# Patient Record
Sex: Female | Born: 1960 | Race: Black or African American | Hispanic: No | Marital: Single | Smoking: Former smoker
Health system: Southern US, Community
[De-identification: ages and names within clinical notes are randomized; demographics above are authoritative.]

## PROBLEM LIST (undated history)

## (undated) DIAGNOSIS — D649 Anemia, unspecified: Secondary | ICD-10-CM

## (undated) DIAGNOSIS — B019 Varicella without complication: Secondary | ICD-10-CM

## (undated) HISTORY — DX: Anemia, unspecified: D64.9

## (undated) HISTORY — PX: PARTIAL HYSTERECTOMY: SHX80

## (undated) HISTORY — DX: Varicella without complication: B01.9

---

## 1999-02-26 ENCOUNTER — Other Ambulatory Visit: Admission: RE | Admit: 1999-02-26 | Discharge: 1999-02-26 | Payer: Self-pay | Admitting: Internal Medicine

## 2008-04-19 ENCOUNTER — Ambulatory Visit: Payer: Self-pay | Admitting: Internal Medicine

## 2008-04-19 DIAGNOSIS — M810 Age-related osteoporosis without current pathological fracture: Secondary | ICD-10-CM | POA: Insufficient documentation

## 2008-04-19 LAB — CONVERTED CEMR LAB
Albumin: 4 g/dL (ref 3.5–5.2)
Bilirubin Urine: NEGATIVE
Blood in Urine, dipstick: NEGATIVE
CO2: 26 meq/L (ref 19–32)
Calcium: 9.8 mg/dL (ref 8.4–10.5)
Chloride: 111 meq/L (ref 96–112)
Creatinine, Ser: 0.9 mg/dL (ref 0.4–1.2)
GFR calc non Af Amer: 71 mL/min
Glucose, Bld: 97 mg/dL (ref 70–99)
Glucose, Urine, Semiquant: NEGATIVE
HDL: 58 mg/dL (ref >39.0)
Ketones, urine, test strip: NEGATIVE
Lymphocytes Relative: 51.7 % — ABNORMAL HIGH (ref 12.0–46.0)
MCV: 92.1 fL (ref 78.0–100.0)
Monocytes Absolute: 0.4 10*3/uL (ref 0.1–1.0)
Monocytes Relative: 11.2 % (ref 3.0–12.0)
Neutro Abs: 1.2 10*3/uL — ABNORMAL LOW (ref 1.4–7.7)
Neutrophils Relative %: 33.3 % — ABNORMAL LOW (ref 43.0–77.0)
Potassium: 4.7 meq/L (ref 3.5–5.1)
Protein, U semiquant: NEGATIVE
TSH: 0.75 microintl units/mL (ref 0.35–5.50)
Total Protein: 7.6 g/dL (ref 6.0–8.3)
Triglycerides: 40 mg/dL (ref 0–149)
VLDL: 8 mg/dL (ref 0–40)
WBC: 3.5 10*3/uL — ABNORMAL LOW (ref 4.5–10.5)
pH: 5

## 2008-05-01 ENCOUNTER — Encounter: Payer: Self-pay | Admitting: Internal Medicine

## 2008-06-04 ENCOUNTER — Encounter: Payer: Self-pay | Admitting: Internal Medicine

## 2008-12-30 ENCOUNTER — Encounter: Payer: Self-pay | Admitting: Internal Medicine

## 2009-03-18 ENCOUNTER — Telehealth: Payer: Self-pay | Admitting: *Deleted

## 2009-05-07 ENCOUNTER — Ambulatory Visit: Payer: Self-pay | Admitting: Vascular Surgery

## 2010-02-03 ENCOUNTER — Ambulatory Visit: Payer: Self-pay | Admitting: Internal Medicine

## 2010-02-03 LAB — CONVERTED CEMR LAB
ALT: 13 units/L (ref 0–35)
AST: 16 units/L (ref 0–37)
Albumin: 3.7 g/dL (ref 3.5–5.2)
Basophils Absolute: 0 10*3/uL (ref 0.0–0.1)
Bilirubin Urine: NEGATIVE
Cholesterol: 181 mg/dL (ref 0–200)
Creatinine, Ser: 1 mg/dL (ref 0.4–1.2)
Eosinophils Absolute: 0.1 10*3/uL (ref 0.0–0.7)
Eosinophils Relative: 1.8 % (ref 0.0–5.0)
GFR calc non Af Amer: 75.66 mL/min (ref >60)
Glucose, Bld: 94 mg/dL (ref 70–99)
Glucose, Urine, Semiquant: NEGATIVE
HCT: 35.3 % — ABNORMAL LOW (ref 36.0–46.0)
HDL: 53.7 mg/dL (ref >39.00)
Lymphocytes Relative: 50.2 % — ABNORMAL HIGH (ref 12.0–46.0)
MCHC: 33 g/dL (ref 30.0–36.0)
MCV: 94.7 fL (ref 78.0–100.0)
Monocytes Relative: 11.7 % (ref 3.0–12.0)
RBC: 3.73 M/uL — ABNORMAL LOW (ref 3.87–5.11)
Total Bilirubin: 0.3 mg/dL (ref 0.3–1.2)
Triglycerides: 48 mg/dL (ref 0.0–149.0)
WBC: 3.1 10*3/uL — ABNORMAL LOW (ref 4.5–10.5)
pH: 5.5

## 2010-02-10 ENCOUNTER — Ambulatory Visit: Payer: Self-pay | Admitting: Internal Medicine

## 2010-02-10 DIAGNOSIS — D649 Anemia, unspecified: Secondary | ICD-10-CM | POA: Insufficient documentation

## 2010-02-10 DIAGNOSIS — R51 Headache: Secondary | ICD-10-CM | POA: Insufficient documentation

## 2010-02-10 DIAGNOSIS — R82998 Other abnormal findings in urine: Secondary | ICD-10-CM | POA: Insufficient documentation

## 2010-02-10 DIAGNOSIS — R519 Headache, unspecified: Secondary | ICD-10-CM | POA: Insufficient documentation

## 2010-02-10 LAB — CONVERTED CEMR LAB
Basophils Absolute: 0 10*3/uL (ref 0.0–0.1)
Basophils Relative: 0.5 % (ref 0.0–3.0)
Bilirubin Urine: NEGATIVE
Blood in Urine, dipstick: NEGATIVE
Eosinophils Relative: 1.5 % (ref 0.0–5.0)
Ferritin: 69.8 ng/mL (ref 10.0–291.0)
Glucose, Urine, Semiquant: NEGATIVE
Hemoglobin: 11.3 g/dL — ABNORMAL LOW (ref 12.0–15.0)
Ketones, urine, test strip: NEGATIVE
Lymphocytes Relative: 43.9 % (ref 12.0–46.0)
Neutro Abs: 1.3 10*3/uL — ABNORMAL LOW (ref 1.4–7.7)
Platelets: 168 10*3/uL (ref 150.0–400.0)
Protein, U semiquant: NEGATIVE
Saturation Ratios: 19.3 % — ABNORMAL LOW (ref 20.0–50.0)
Specific Gravity, Urine: 1.01
Tissue Transglutaminase Ab, IgA: 0 units (ref <7)
Transferrin: 207.2 mg/dL — ABNORMAL LOW (ref 212.0–360.0)
pH: 5

## 2010-02-20 DIAGNOSIS — D509 Iron deficiency anemia, unspecified: Secondary | ICD-10-CM | POA: Insufficient documentation

## 2010-03-05 ENCOUNTER — Telehealth: Payer: Self-pay | Admitting: *Deleted

## 2010-03-06 ENCOUNTER — Encounter (INDEPENDENT_AMBULATORY_CARE_PROVIDER_SITE_OTHER): Payer: Self-pay | Admitting: *Deleted

## 2010-03-09 ENCOUNTER — Telehealth (INDEPENDENT_AMBULATORY_CARE_PROVIDER_SITE_OTHER): Payer: Self-pay | Admitting: *Deleted

## 2010-03-20 ENCOUNTER — Ambulatory Visit: Payer: Self-pay | Admitting: Internal Medicine

## 2010-03-20 DIAGNOSIS — D72819 Decreased white blood cell count, unspecified: Secondary | ICD-10-CM

## 2010-03-23 LAB — CONVERTED CEMR LAB
Basophils Absolute: 0 10*3/uL (ref 0.0–0.1)
Eosinophils Relative: 2.2 % (ref 0.0–5.0)
HCT: 35 % — ABNORMAL LOW (ref 36.0–46.0)
Lymphs Abs: 1.3 10*3/uL (ref 0.7–4.0)
MCV: 91.6 fL (ref 78.0–100.0)
Monocytes Absolute: 0.6 10*3/uL (ref 0.1–1.0)
Neutrophils Relative %: 52.7 % (ref 43.0–77.0)
Platelets: 179 10*3/uL (ref 150.0–400.0)
RDW: 13.3 % (ref 11.5–14.6)
WBC: 4.3 10*3/uL — ABNORMAL LOW (ref 4.5–10.5)

## 2010-03-25 ENCOUNTER — Encounter: Payer: Self-pay | Admitting: Internal Medicine

## 2010-04-29 ENCOUNTER — Ambulatory Visit: Payer: Self-pay | Admitting: Gastroenterology

## 2010-05-08 ENCOUNTER — Ambulatory Visit: Payer: Self-pay | Admitting: Gastroenterology

## 2010-05-08 LAB — CONVERTED CEMR LAB: Fecal Occult Bld: NEGATIVE

## 2010-06-30 ENCOUNTER — Telehealth: Payer: Self-pay | Admitting: Internal Medicine

## 2010-07-01 ENCOUNTER — Ambulatory Visit: Payer: Self-pay | Admitting: Internal Medicine

## 2010-07-01 DIAGNOSIS — R300 Dysuria: Secondary | ICD-10-CM | POA: Insufficient documentation

## 2010-07-01 DIAGNOSIS — R350 Frequency of micturition: Secondary | ICD-10-CM

## 2010-07-01 LAB — CONVERTED CEMR LAB
Glucose, Urine, Semiquant: NEGATIVE
Nitrite: NEGATIVE
Specific Gravity, Urine: 1.01
WBC Urine, dipstick: NEGATIVE
pH: 6

## 2010-07-02 ENCOUNTER — Encounter: Payer: Self-pay | Admitting: Internal Medicine

## 2010-11-30 ENCOUNTER — Other Ambulatory Visit
Admission: RE | Admit: 2010-11-30 | Discharge: 2010-11-30 | Payer: Self-pay | Source: Home / Self Care | Admitting: Internal Medicine

## 2010-11-30 ENCOUNTER — Ambulatory Visit: Payer: Self-pay | Admitting: Internal Medicine

## 2010-11-30 DIAGNOSIS — N390 Urinary tract infection, site not specified: Secondary | ICD-10-CM

## 2010-11-30 DIAGNOSIS — N760 Acute vaginitis: Secondary | ICD-10-CM | POA: Insufficient documentation

## 2010-11-30 LAB — CONVERTED CEMR LAB
Nitrite: POSITIVE
Specific Gravity, Urine: 1.01
Urobilinogen, UA: 0.2

## 2010-12-01 ENCOUNTER — Encounter: Payer: Self-pay | Admitting: Internal Medicine

## 2010-12-04 ENCOUNTER — Telehealth: Payer: Self-pay | Admitting: Internal Medicine

## 2010-12-09 ENCOUNTER — Ambulatory Visit
Admission: RE | Admit: 2010-12-09 | Discharge: 2010-12-09 | Payer: Self-pay | Source: Home / Self Care | Attending: Internal Medicine | Admitting: Internal Medicine

## 2010-12-09 DIAGNOSIS — A5901 Trichomonal vulvovaginitis: Secondary | ICD-10-CM

## 2011-01-03 ENCOUNTER — Encounter: Payer: Self-pay | Admitting: Internal Medicine

## 2011-01-14 NOTE — Progress Notes (Signed)
Summary: Returning your call  Phone Note Call from Patient Call back at [phone]   Caller: Patient Summary of Call: Patient is returning your call from Friday.  Left no other information. Initial call taken by: Everrett Coombe,  March 09, 2010 2:26 PM  Follow-up for Phone Call        Returned call and advised patient of appt info with GI. Follow-up by: Trixie Dredge,  March 09, 2010 2:43 PM

## 2011-01-14 NOTE — Procedures (Signed)
Summary: Colonoscopy  Patient: [name] Note: All result statuses are Final unless otherwise noted.  Tests: (1) Colonoscopy (COL)   COL Colonoscopy           DONE     [facility]     [address]     [place], [place]  [address]           COLONOSCOPY PROCEDURE REPORT           PATIENT:  Sue, Golden  MR#:  [id number]     BIRTHDATE:  [birth year], 48 yrs. old  GENDER:  female           ENDOSCOPIST:  Barbette Hair. Arlyce Dice, MD     Referred by:  Neta Mends. Panosh, M.D.           PROCEDURE DATE:  05/08/2010     PROCEDURE:  Diagnostic Colonoscopy     ASA CLASS:  Class I     INDICATIONS:  1) anemia           MEDICATIONS:   Fentanyl 75 mcg IV, Versed 8 mg IV           DESCRIPTION OF PROCEDURE:   After the risks benefits and     alternatives of the procedure were thoroughly explained, informed     consent was obtained.  Digital rectal exam was performed and     revealed no abnormalities.   The LB CF-[id number] endoscope     was introduced through the anus and advanced to the cecum, which     was identified by both the appendix and ileocecal valve, without     limitations.  The quality of the prep was good, using MoviPrep.     The instrument was then slowly withdrawn as the colon was fully     examined.     <<PROCEDUREIMAGES>>           FINDINGS:  A normal appearing cecum, ileocecal valve, and     appendiceal orifice were identified. The ascending, hepatic     flexure, transverse, splenic flexure, descending, sigmoid colon,     and rectum appeared unremarkable (see image1, image3, image4,     image6, image7, image8, and image9).   Retroflexed views in the     rectum revealed no abnormalities.    The time to cecum =  5.25     minutes. The scope was then withdrawn (time =  5.0  min) from the     patient and the procedure completed.           COMPLICATIONS:  None           ENDOSCOPIC IMPRESSION:     1) Normal colon     RECOMMENDATIONS:No further GI workup (recent stool  heme negative)           REPEAT EXAM:  In 10 year(s) for Colonoscopy.           ______________________________     Barbette Hair. Arlyce Dice, MD           CC:           n.     eSIGNED:   Barbette Hair. Johnn Krasowski at 05/08/2010 02:26 PM           Wadie Lessen, [id number]  Note: An exclamation mark (!) indicates a result that was not dispersed into the flowsheet. Document Creation Date: 05/08/2010 2:27 PM _______________________________________________________________________  (1) Order result status: Final Collection or observation date-time: 05/08/2010 14:17 Requested date-time:  Receipt date-time:  Reported date-time:  Referring Physician:   Ordering Physician: Melvia Heaps [phone]) Specimen Source:  Source: Launa Grill Order Number: [id number] Lab site:   Appended Document: Colonoscopy    Clinical Lists Changes  Observations: Added new observation of COLONNXTDUE: 04/2020 (05/08/2010 16:03)

## 2011-01-14 NOTE — Progress Notes (Signed)
Summary: UTI  Phone Note Call from Patient Call back at [phone]   Caller: Patient Summary of Call: Pt called saying that she is frequent urination on the hour, no lower back, some discomfort and burning upon urination. This started on 7/18 in am. Pt to come in tomorrow. Initial call taken by: Romualdo Bolk, CMA (AAMA),  June 30, 2010 4:18 PM

## 2011-01-14 NOTE — Letter (Signed)
Summary: [facility] Instructions  [facility]  [address] [place], [place] [address]   Phone: [phone]  Fax: [phone]       SHAMAYA KAUER    [birth year]    MRN: [id number]        Procedure Day /Date:FRIDAY 05/08/2010     Arrival Time:12:30PM     Procedure Time:1:30PM     Location of Procedure:                    [facility] (4th Floor)   PREPARATION FOR COLONOSCOPY WITH MOVIPREP   Starting 5 days prior to your procedure 5/22/2011do not eat nuts, seeds, popcorn, corn, beans, peas,  salads, or any raw vegetables.  Do not take any fiber supplements (e.g. Metamucil, Citrucel, and Benefiber).  THE DAY BEFORE YOUR PROCEDURE         DATE: 05/07/2010  DAY: THURSDAY  1.  Drink clear liquids the entire day-NO SOLID FOOD  2.  Do not drink anything colored red or purple.  Avoid juices with pulp.  No orange juice.  3.  Drink at least 64 oz. (8 glasses) of fluid/clear liquids during the day to prevent dehydration and help the prep work efficiently.  CLEAR LIQUIDS INCLUDE: Water Jello Ice Popsicles Tea (sugar ok, no milk/cream) Powdered fruit flavored drinks Coffee (sugar ok, no milk/cream) Gatorade Juice: apple, white grape, white cranberry  Lemonade Clear bullion, consomm, broth Carbonated beverages (any kind) Strained chicken noodle soup Hard Candy                             4.  In the morning, mix first dose of MoviPrep solution:    Empty 1 Pouch A and 1 Pouch B into the disposable container    Add lukewarm drinking water to the top line of the container. Mix to dissolve    Refrigerate (mixed solution should be used within 24 hrs)  5.  Begin drinking the prep at 5:00 p.m. The MoviPrep container is divided by 4 marks.   Every 15 minutes drink the solution down to the next mark (approximately 8 oz) until the full liter is complete.   6.  Follow completed prep with 16 oz of clear liquid of your choice (Nothing red or purple).   Continue to drink clear liquids until bedtime.  7.  Before going to bed, mix second dose of MoviPrep solution:    Empty 1 Pouch A and 1 Pouch B into the disposable container    Add lukewarm drinking water to the top line of the container. Mix to dissolve    Refrigerate  THE DAY OF YOUR PROCEDURE      DATE:05/08/2010 DAY: FRIDAY  Beginning at 8:30a.m. (5 hours before procedure):         1. Every 15 minutes, drink the solution down to the next mark (approx 8 oz) until the full liter is complete.  2. Follow completed prep with 16 oz. of clear liquid of your choice.    3. You may drink clear liquids until 11:30AM (2 HOURS BEFORE PROCEDURE).   MEDICATION INSTRUCTIONS  Unless otherwise instructed, you should take regular prescription medications with a small sip of water   as early as possible the morning of your procedure. HOLD IRON 7 DAYS PRIOR PER DR KAPLAN         OTHER INSTRUCTIONS  You will need a responsible adult at least 51  years of age to accompany you and drive you home.   This person must remain in the waiting room during your procedure.  Wear loose fitting clothing that is easily removed.  Leave jewelry and other valuables at home.  However, you may wish to bring a book to read or  an iPod/MP3 player to listen to music as you wait for your procedure to start.  Remove all body piercing jewelry and leave at home.  Total time from sign-in until discharge is approximately 2-3 hours.  You should go home directly after your procedure and rest.  You can resume normal activities the  day after your procedure.  The day of your procedure you should not:   Drive   Make legal decisions   Operate machinery   Drink alcohol   Return to work  You will receive specific instructions about eating, activities and medications before you leave.    The above instructions have been reviewed and explained to me by   _______________________    I fully understand and  can verbalize these instructions _____________________________ Date _________

## 2011-01-14 NOTE — Assessment & Plan Note (Signed)
Summary: vag discharge/dm   Vital Signs:  Patient profile:   50 year old female Menstrual status:  hysterectomy Weight:      172 pounds Pulse rate:   78 / minute BP sitting:   130 / 80  (left arm) Cuff size:   regular  Vitals Entered By: Romualdo Bolk, CMA Duncan Dull) (December 09, 2010 12:14 PM) CC: Vaginal Discharge- yellowish with some redness to it on 12/27. Still has irriation.   History of Present Illness: Sue Golden comes in today  for acute appt today for vaginitis  problems  since last visit took antibioitc but ucx showed no growth. she has vaginal irritation   pap and  vaginalysis eval not back yet  1 partner  no known signs   Preventive Screening-Counseling & Management  Alcohol-Tobacco     Alcohol drinks/day: 0     Smoking Status: quit     Year Quit: 1999  Caffeine-Diet-Exercise     Caffeine use/day: 1     Does Patient Exercise: no  Current Medications (verified): 1)  Vitamin D 400 Unit Caps (Cholecalciferol) 2)  Vitamin E 100 Unit Caps (Vitamin E) 3)  Feosol 200 (65 Fe) Mg Tabs (Ferrous Sulfate Dried) .... 2 By Mouth Once Daily 4)  Vitamin C 500 Mg  Tabs (Ascorbic Acid) 5)  Niacin Cr 250 Mg Cr-Tabs (Niacin)  Allergies (verified): No Known Drug Allergies  Past History:  Past medical, surgical, family and social histories (including risk factors) reviewed for relevance to current acute and chronic problems.  Past Medical History: Reviewed history from 11/30/2010 and no changes required. chicken pox as a child G2P2 Anemia UTI citrobacter  Past Surgical History: Reviewed history from 02/10/2010 and no changes required. Hysterectomy bleeding after childbirth  dec 1994  Appendectomy  Family History: Reviewed history from 04/29/2010 and no changes required. Family History Hypertension mom  Died  01-21-23 from CVA 29 Family History Diabetes 1st degree relative father  deceased 94 years, siblings siblings health   DM LIPIDs    Neg for anemia No FH of  Colon Cancer:  Social History: Reviewed history from 04/29/2010 and no changes required. 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    Physical Exam  General:  Well-developed,well-nourished,in no acute distress; alert,appropriate and cooperative throughout examination Head:  normocephalic and atraumatic.   Abdomen:  soft, non-tender, and no masses.   Genitalia:  normal introitus and no external lesions.  watery yellow foamy dc with diffuse vag erythema and faint amount of blood.  cx os clear  Psych:  Oriented X3, normally interactive, good eye contact, and not anxious appearing.     Impression & Recommendations:  Problem # 1:  VAGINITIS (ICD-616.10) Probably trichomonas   as shows  prelim on pap   and clinically consistent currently  bd affirm is still pending.     disc  rx partner etc    will rx now and follow  up closely . The following medications were removed from the medication list:    Nitrofurantoin Macrocrystal 100 Mg Caps (Nitrofurantoin macrocrystal) .Marland Kitchen... 1 by mouth  two times a day Her updated medication list for this problem includes:    Metronidazole 500 Mg Tabs (Metronidazole) .Marland Kitchen... 1.  1 by mouth two times a day for 7 days   2. take 4 pills by mouth x 1  Complete Medication List: 1)  Vitamin D 400 Unit Caps (Cholecalciferol) 2)  Vitamin E 100 Unit Caps (Vitamin e) 3)  Feosol 200 (65 Fe) Mg Tabs (Ferrous sulfate dried) .... 2 by mouth once daily 4)  Vitamin C 500 Mg Tabs (Ascorbic acid) 5)  Niacin Cr 250 Mg Cr-tabs (Niacin) 6)  Metronidazole 500 Mg Tabs (Metronidazole) .... 1. 1 by mouth two times a day for 7 days   2. take 4 pills by mouth x 1  Patient Instructions: 1)  take medication as directed for trich and poss bv 2)   partner should be rx ed also 3)  call in a week about how you are doing.  4)  wil l call when rest of labs back.   Prescriptions: METRONIDAZOLE 500 MG TABS (METRONIDAZOLE) 1. 1 by mouth two times a day for 7 days   2. take 4 pills by mouth x 1  #18 x 1   Entered and Authorized by:   Madelin Headings MD   Signed by:   Madelin Headings MD on 12/09/2010   Method used:   Electronically to        [facility] [phone]* (retail)       [address]       [place], [place]  [address]       Ph: [phone]       Fax: [phone]   RxID:   [id number]    Orders Added: 1)  Est. Patient Level III [59563]

## 2011-01-14 NOTE — Assessment & Plan Note (Signed)
Summary: cpx/cjr   Vital Signs:  Patient profile:   50 year old female Menstrual status:  hysterectomy Height:      65 inches Weight:      163 pounds BMI:     27.22 Pulse rate:   66 / minute BP sitting:   120 / 80  (left arm) Cuff size:   regular  Vitals Entered By: Romualdo Bolk, CMA (AAMA) (February 10, 2010 9:59 AM) CC: CPX     Menstrual Status hysterectomy   History of Present Illness: Sue Golden comesin today for   for preventive visit . Since last visit  here  there have been no major changes in health status  .  No bleeding bruising  .   No blood donation.  No hx of anemia. Feels healthy but tired at times. Mom died  suddenly form CVA in [place] and works long hours . No gi bleeding or abdominal pain. No UTI signs .  HA  see ros  take goodies as needed.       Preventive Care Screening  Mammogram:    Date:  05/01/2008    Results:  normal   Prior Values:    Last Tetanus Booster:  Tdap (04/19/2008)   Preventive Screening-Counseling & Management  Alcohol-Tobacco     Alcohol drinks/day: 0     Smoking Status: quit     Year Quit: 1999  Caffeine-Diet-Exercise     Caffeine use/day: 1     Does Patient Exercise: no  Hep-HIV-STD-Contraception     Dental Visit-last 6 months yes  Safety-Violence-Falls     Seat Belt Use: yes     Firearms in the Home: no firearms in the home     Smoke Detectors: yes  Current Medications (verified): 1)  Biotin 10 Mg Tabs (Biotin) 2)  Vitamin D 400 Unit Caps (Cholecalciferol) 3)  Vitamin E 100 Unit Caps (Vitamin E)  Allergies (verified): No Known Drug Allergies  Past History:  Past medical, surgical, family and social histories (including risk factors) reviewed, and no changes noted (except as noted below).  Past Medical History: Unremarkable chicken pox as a child G2P2    Past Surgical History: Hysterectomy bleeding after childbirth  dec 1994  Appendectomy  Past History:  Care Management: Dermatology:  [facility]  Family History: Reviewed history from 04/19/2008 and no changes required. Family History Hypertension mom  Died  12-25-2022 from CVA 48 Family History Diabetes 1st degree relative father  deceased 47 years  siblings health   DM LIPIDs    Neg colon polyps of cancer .    Social History: Reviewed history from 04/19/2008 and no changes required. 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         ha  at times   ? if sinus  problem.    takes a goodie powder  as needed   about  4 x per month or less.     tingling  right fingers at night ocass no weakness  thinksrelated to working  situation. Physical Exam General Appearance: well developed, well nourished, no acute distress Eyes: conjunctiva and lids normal, PERRLA, EOMI,  WNL Ears, Nose, Mouth, Throat: TM clear, nares clear, oral exam WNL Neck: supple, no lymphadenopathy, no thyromegaly, no JVD Respiratory: clear to auscultation and percussion, respiratory effort normal Cardiovascular: regular rate and rhythm, S1-S2, no murmur, rub or gallop, no bruits, peripheral pulses normal and symmetric, no cyanosis, clubbing, edema or varicosities Chest: no scars, masses, tenderness; no asymmetry, skin changes, nipple discharge     Gastrointestinal: soft, non-tender; no hepatosplenomegaly, masses; active bowel sounds all quadrants, guaiac negative stool; no masses, tenderness, hemorrhoids   Genitourinary: no vaginal discharge, lesions; no masses or tenderness  bimanual nl  Lymphatic: no cervical, axillary or inguinal adenopathy Musculoskeletal: gait normal, muscle tone and strength WNL, no joint swelling, effusions, discoloration, crepitus  Skin: clear, good turgor, color WNL, no rashes, lesions, or ulcerations Neurologic: normal mental status, normal reflexes, normal strength, sensation, and motion Psychiatric: alert; oriented to person, place and time Other Exam:   labs   hg 11.7  wbc low    urine nitirtes positive.     Impression & Recommendations:  Problem # 1:  PREVENTIVE HEALTH CARE (ICD-V70.0) Discussed nutrition,exercise,diet,healthy weight, vitamin D and calcium.   Problem # 2:  UNSPECIFIED ANEMIA (ICD-285.9) Assessment: New  ? cause   no   symptom    normal exam  need further work up. Orders: TLB-CBC Platelet - w/Differential (85025-CBCD) TLB-B12 + Folate Pnl ([id number]/URK) TLB-IBC Pnl (Iron/FE;Transferrin) (83550-IBC) TLB-Ferritin (82728-FER) T-Celiac Disease Ab Evaluation (8002) TLB-Sedimentation Rate (ESR) (85652-ESR) Venipuncture (27062)  Hgb: 11.7 (02/03/2010)   Hct: 35.3 (02/03/2010)   Platelets: 189.0 (02/03/2010) RBC: 3.73 (02/03/2010)   RDW: 11.9 (02/03/2010)   WBC: 3.1 (02/03/2010) MCV: 94.7 (02/03/2010)   MCHC: 33.0 (02/03/2010) TSH: 1.25 (02/03/2010)  Problem # 3:  HEADACHE (ICD-784.0) need to ascertain calendar      avoid  rebound  Problem # 4:  URINALYSIS, ABNORMAL (ICD-791.9) Assessment: New ? uti bacteriuria .    Orders: T-Culture, Urine ([id number])  Complete Medication List: 1)  Biotin 10 Mg Tabs (Biotin) 2)  Vitamin D 400 Unit Caps (Cholecalciferol) 3)  Vitamin E 100 Unit Caps (Vitamin e)  Patient Instructions: 1)  will do  check about your mild anemia and   culture test of your urine. 2)  You will be informed of lab results when available. and then plan follow up  3)  Do HA calendar for the next month  and  rov in 1 months  4)  Get  stool cards done.   Laboratory Results   Urine Tests  Date/Time Received: February 10, 2010 10:22 AM   Routine Urinalysis   Color: yellow Appearance: Cloudy Glucose: negative   (Normal Range: Negative) Bilirubin: negative   (Normal Range: Negative) Ketone: negative   (Normal Range: Negative) Spec. Gravity: 1.010   (Normal Range: 1.003-1.035) Blood: negative   (Normal Range: Negative) pH: 5.0   (Normal Range: 5.0-8.0) Protein: negative   (Normal Range: Negative) Urobilinogen: 0.2   (Normal Range: 0-1) Nitrite: positive   (Normal Range: Negative) Leukocyte Esterace: trace   (Normal Range: Negative)

## 2011-01-14 NOTE — Progress Notes (Signed)
Summary: Ur culture results  Phone Note Call from Patient   Caller: Patient Call For: Madelin Headings MD Summary of Call: Sue Golden [facility] [address]) Calling for Urine culture results. [phone] Initial call taken by: Lynann Beaver CMA AAMA,  December 04, 2010 12:25 PM  Follow-up for Phone Call        no growth. can finish the abx but doesn't need to change abx Follow-up by: Edwyna Perfect MD,  December 04, 2010 12:30 PM  Additional Follow-up for Phone Call Additional follow up Details #1::        pt aware and verbalized understanding

## 2011-01-14 NOTE — Letter (Signed)
Summary: Return to Work  [facility]  [address] [place], [place] [address]   Phone: [phone]  Fax: [phone]    04/29/2010  TO: Sue Golden IT MAY CONCERN  RE: Sue Golden [address] [id number]   The above named individual was seen in our office today: 04/29/2010    If you have any further questions or need additional information, please call.     Sincerely,   Talani Brazee,MD typed by: [name] CMA (AAMA)

## 2011-01-14 NOTE — Letter (Signed)
Summary: Results Letter  [facility]  [address] [place], [place] [address]   Phone: [phone]  Fax: [phone]        Apr 29, 2010 MRN: [id number]    [facility] [address] [place], [place]  [address]    Dear Ms. Bumpus,  It is my pleasure to have treated you recently as a new patient in my office. I appreciate your confidence and the opportunity to participate in your care.  Since I do have a busy inpatient endoscopy schedule and office schedule, my office hours vary weekly. I am, however, available for emergency calls everyday through my office. If I am not available for an urgent office appointment, another one of our gastroenterologist will be able to assist you.  My well-trained staff are prepared to help you at all times. For emergencies after office hours, a physician from our Gastroenterology section is always available through my 24 hour answering service  Once again I welcome you as a new patient and I look forward to a happy and healthy relationship             Sincerely,  Louis Meckel MD  This letter has been electronically signed by your physician.  Appended Document: Results Letter letter mailed

## 2011-01-14 NOTE — Progress Notes (Signed)
Summary: Referral  Phone Note Call from Patient Call back at [phone]   Caller: Patient Reason for Call: Talk to Nurse Summary of Call: Wants to speak to you regarding a referral. Initial call taken by: Everrett Coombe,  March 05, 2010 2:44 PM  Follow-up for Phone Call        [facility] Follow-up by: Romualdo Bolk, CMA Duncan Dull),  March 06, 2010 1:07 PM  Additional Follow-up for Phone Call Additional follow up Details #1::        [facility]MT[facility]- See phone note from 3/28. Pt is aware of appt. Additional Follow-up by: Romualdo Bolk, CMA (AAMA),  March 11, 2010 12:05 PM

## 2011-01-14 NOTE — Assessment & Plan Note (Signed)
Summary: UTI sx/ssc   Vital Signs:  Patient profile:   50 year old female Menstrual status:  hysterectomy Weight:      166 pounds Temp:     98.4 degrees F oral Pulse rate:   78 / minute BP sitting:   154 / 80  (left arm) Cuff size:   regular  Vitals Entered By: Romualdo Bolk, CMA (AAMA) (July 01, 2010 12:17 PM) CC: Frequent urination, vaginal discomfort that started 7/18   History of Present Illness: Sue Golden  sudden onset of urinary frequency  for 2 days  .      mild discomfort  and  no sig dysuria.  no change in diet or caffine input    used cranberry juice     for the last t 2 days.  No fever flank pain abd pain or sores.   Unsure what is causing .signs   no vag dc or sig et itching.      Preventive Screening-Counseling & Management  Alcohol-Tobacco     Alcohol drinks/day: 0     Smoking Status: quit     Year Quit: 1999  Caffeine-Diet-Exercise     Caffeine use/day: 1     Does Patient Exercise: no  Current Medications (verified): 1)  Biotin 10 Mg Tabs (Biotin) 2)  Vitamin D 400 Unit Caps (Cholecalciferol) 3)  Vitamin E 100 Unit Caps (Vitamin E) 4)  Feosol 200 (65 Fe) Mg Tabs (Ferrous Sulfate Dried) .... 2 By Mouth Once Daily 5)  Vitamin C 500 Mg  Tabs (Ascorbic Acid)  Allergies (verified): No Known Drug Allergies  Past History:  Past medical, surgical, family and social histories (including risk factors) reviewed for relevance to current acute and chronic problems.  Past Medical History: Reviewed history from 04/29/2010 and no changes required. chicken pox as a child G2P2 Anemia  Past Surgical History: Reviewed history from 02/10/2010 and no changes required. Hysterectomy bleeding after childbirth  dec 1994  Appendectomy  Past History:  Care Management: Dermatology: Joseph Art  Family History: Reviewed history from 04/29/2010 and no changes required. Family History Hypertension mom  Died  01/16/2023 from CVA 52 Family History Diabetes 1st degree  relative father  deceased 55 years, siblings siblings health   DM LIPIDs    Neg for anemia No FH of Colon Cancer:  Social History: Reviewed history from 04/29/2010 and no changes required. 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   65+ hours per  week  Daily Caffeine Use 1 cup coffee  Review of Systems  The patient denies anorexia, fever, weight loss, weight gain, vision loss, abdominal pain, melena, severe indigestion/heartburn, hematuria, incontinence, genital sores, difficulty walking, and unusual weight change.    Physical Exam  General:  alert, well-developed, well-nourished, and well-hydrated.   Head:  normocephalic and atraumatic.   Neck:  No deformities, masses, or tenderness noted. Lungs:  normal respiratory effort, no intercostal retractions, and no accessory muscle use.   Heart:  normal rate and regular rhythm.   Abdomen:  Bowel sounds positive,abdomen soft and non-tender without masses, organomegaly or hernias noted. no flank pain Genitalia:  normal introitus, no external lesions, mucosa pink and moist, no vaginal or cervical lesions, no vaginal atrophy, and no friaility or hemorrhage.  no lesions   white dicharge  Extremities:  pulses intact without delay    no clubbing cyanosis or  edema  Skin:  turgor normal, color normal, no ecchymoses, and no petechiae.   Cervical Nodes:  No lymphadenopathy noted Inguinal Nodes:  No significant adenopathy Psych:  Oriented X3, normally interactive, good eye contact, and not anxious appearing.     Impression & Recommendations:  Problem # 1:  FREQUENCY, URINARY (ICD-788.41) r/o  UTI although  subtle signs  and could have a vaginiitis. no hx of same  Orders: T-Culture, Urine ([id number])  Problem # 2:  IRON DEFICIENCY (ICD-280.9) continue iron  Her updated medication list for this problem includes:    Feosol 200  (65 Fe) Mg Tabs (Ferrous sulfate dried) .Marland Kitchen... 2 by mouth once daily  Complete Medication List: 1)  Biotin 10 Mg Tabs (Biotin) 2)  Vitamin D 400 Unit Caps (Cholecalciferol) 3)  Vitamin E 100 Unit Caps (Vitamin e) 4)  Feosol 200 (65 Fe) Mg Tabs (Ferrous sulfate dried) .... 2 by mouth once daily 5)  Vitamin C 500 Mg Tabs (Ascorbic acid)  Other Orders: UA Dipstick w/o Micro (automated)  (81003)  Patient Instructions: 1)  use otc monistat or vagina l yeast medicine.   2)  will call about culture  results when available .    3)  limit caffiene.    4)  if continuing call   and do further evaluation.  Laboratory Results   Urine Tests  Date/Time Recieved: July 01, 2010 12:15 PM  Date/Time Reported: July 01, 2010 12:15 PM   Routine Urinalysis   Color: yellow Appearance: Clear Glucose: negative   (Normal Range: Negative) Bilirubin: negative   (Normal Range: Negative) Ketone: negative   (Normal Range: Negative) Spec. Gravity: 1.010   (Normal Range: 1.003-1.035) Blood: negative   (Normal Range: Negative) pH: 6.0   (Normal Range: 5.0-8.0) Protein: negative   (Normal Range: Negative) Urobilinogen: 0.2   (Normal Range: 0-1) Nitrite: negative   (Normal Range: Negative) Leukocyte Esterace: negative   (Normal Range: Negative)    Comments: Wynona Canes, CMA  July 01, 2010 12:15 PM

## 2011-01-14 NOTE — Letter (Signed)
Summary: [place] [facility]: Immunoassay Fecal Occult Blood (iFOB) Order [facility]  [address] [place], [place] [address]   Phone: [phone]  Fax: [phone]      [place] [facility]: Immunoassay Fecal Occult Blood (iFOB) Order Form   Apr 29, 2010 MRN: [id number]   Sue Golden 1961-11-04   Physicican Name:robert kaplan Diagnosis Code:anemia  280.9     [name] CMA (AAMA)

## 2011-01-14 NOTE — Assessment & Plan Note (Signed)
Summary: vaginal discomfort//ccm   Vital Signs:  Patient profile:   50 year old female Menstrual status:  hysterectomy Weight:      170 pounds Temp:     98.5 degrees F oral Pulse rate:   72 / minute BP sitting:   130 / 90  (left arm) Cuff size:   regular  Vitals Entered By: Romualdo Bolk, CMA (AAMA) (November 30, 2010 9:55 AM) CC: Vaginal itching and pain - slight discharge that is white but when it drys up it's yellow. Some frequent urination but not light before.   History of Present Illness: Sue Golden comein comes in today  for acute visit .  with above signs  for at least a week  used [employer]  brand  3 days monistat last week   and  and no change  in signs .  Not s much frequency like before.  see last note when had uti and rx with sxt .  Last time uti resolved quickly with meds .  No other change in health status . NO NVD no rash  / No exposures or abd pain.  Preventive Screening-Counseling & Management  Alcohol-Tobacco     Alcohol drinks/day: 0     Smoking Status: quit     Year Quit: 1999  Caffeine-Diet-Exercise     Caffeine use/day: 1     Does Patient Exercise: no  Current Medications (verified): 1)  Vitamin D 400 Unit Caps (Cholecalciferol) 2)  Vitamin E 100 Unit Caps (Vitamin E) 3)  Feosol 200 (65 Fe) Mg Tabs (Ferrous Sulfate Dried) .... 2 By Mouth Once Daily 4)  Vitamin C 500 Mg  Tabs (Ascorbic Acid) 5)  Niacin Cr 250 Mg Cr-Tabs (Niacin)  Allergies (verified): No Known Drug Allergies  Past History:  Past medical, surgical, family and social histories (including risk factors) reviewed, and no changes noted (except as noted below).  Past Medical History: chicken pox as a child G2P2 Anemia UTI citrobacter  Past Surgical History: Reviewed history from 02/10/2010 and no changes required. Hysterectomy bleeding after childbirth  dec 1994  Appendectomy  Past History:  Care Management: Dermatology: Joseph Art  Family History: Reviewed history from  04/29/2010 and no changes required. Family History Hypertension mom  Died  2023-01-01 from CVA 77 Family History Diabetes 1st degree relative father  deceased 67 years, siblings siblings health   DM LIPIDs    Neg for anemia No FH of Colon Cancer:  Social History: Reviewed history from 04/29/2010 and no changes required. 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   65+ hours per  week  Daily Caffeine Use 1 cup coffee  Review of Systems  The patient denies anorexia, fever, chest pain, syncope, abdominal pain, melena, hematuria, incontinence, genital sores, abnormal bleeding, and enlarged lymph nodes.    Physical Exam  General:  Well-developed,well-nourished,in no acute distress; alert,appropriate and cooperative throughout examination Head:  normocephalic and atraumatic.   Lungs:  normal respiratory effort and no intercostal retractions.   Abdomen:  Bowel sounds positive,abdomen soft and non-tender without masses, organomegaly or hernias noted. Genitalia:  normal introitus and no external lesions.  yellow whit vag dc  some vag redness  no lesions  cx absent  urethra looks nl   PAP done and   BD affirm  done   Pulses:  nl cap refill Skin:  turgor normal and  color normal.   Cervical Nodes:  No lymphadenopathy noted Inguinal Nodes:  No significant adenopathy Psych:  Oriented X3, memory intact for recent and remote, not anxious appearing, and not depressed appearing.     Impression & Recommendations:  Problem # 1:  DYSURIA (ICD-788.1)  UTI  looks like uti  last one was citrobacter and somewhat atypical but resolved quickly on meds    The following medications were removed from the medication list:    Septra Ds 800-160 Mg Tabs (Sulfamethoxazole-trimethoprim) .Marland Kitchen... 1 by mouth two times a day Her updated medication list for this problem includes:    Nitrofurantoin Macrocrystal  100 Mg Caps (Nitrofurantoin macrocrystal) .Marland Kitchen... 1 by mouth  two times a day  Problem # 2:  VAGINITIS (ICD-616.10) ? BV  prob not the cause of her signs   The following medications were removed from the medication list:    Septra Ds 800-160 Mg Tabs (Sulfamethoxazole-trimethoprim) .Marland Kitchen... 1 by mouth two times a day Her updated medication list for this problem includes:    Nitrofurantoin Macrocrystal 100 Mg Caps (Nitrofurantoin macrocrystal) .Marland Kitchen... 1 by mouth  two times a day  Problem # 3:  UTI (ICD-599.0)  see above . The following medications were removed from the medication list:    Septra Ds 800-160 Mg Tabs (Sulfamethoxazole-trimethoprim) .Marland Kitchen... 1 by mouth two times a day Her updated medication list for this problem includes:    Nitrofurantoin Macrocrystal 100 Mg Caps (Nitrofurantoin macrocrystal) .Marland Kitchen... 1 by mouth  two times a day  Orders: T-Culture, Urine ([id number])  Complete Medication List: 1)  Vitamin D 400 Unit Caps (Cholecalciferol) 2)  Vitamin E 100 Unit Caps (Vitamin e) 3)  Feosol 200 (65 Fe) Mg Tabs (Ferrous sulfate dried) .... 2 by mouth once daily 4)  Vitamin C 500 Mg Tabs (Ascorbic acid) 5)  Niacin Cr 250 Mg Cr-tabs (Niacin) 6)  Nitrofurantoin Macrocrystal 100 Mg Caps (Nitrofurantoin macrocrystal) .Marland Kitchen.. 1 by mouth  two times a day  Other Orders: UA Dipstick w/o Micro (automated)  (81003)  Patient Instructions: 1)  take antibioitc   and will call with results about any further rx.   2)  call if not better   by end of med  or worse . Prescriptions: NITROFURANTOIN MACROCRYSTAL 100 MG CAPS (NITROFURANTOIN MACROCRYSTAL) 1 by mouth  two times a day  #14 x 0   Entered and Authorized by:   Madelin Headings MD   Signed by:   Madelin Headings MD on 11/30/2010   Method used:   Electronically to        [facility] [phone]* (retail)       [address]       [place], [place]  [address]       Ph: [phone]       Fax: [phone]   RxID:   [id number]    Orders  Added: 1)  UA Dipstick w/o Micro (automated)  [81003] 2)  T-Culture, Urine [[id number]] 3)  Est. Patient Level IV [[address]]    Prevention & Chronic Care Immunizations   Influenza vaccine: Not documented    Tetanus booster: 04/19/2008: Tdap    Pneumococcal vaccine: Not documented  Colorectal Screening   Hemoccult: Not documented    Colonoscopy: DONE  (05/08/2010)   Colonoscopy due: 04/2020  Other Screening   Pap smear: Not documented    Mammogram: normal  (05/01/2008)   Smoking status: quit  (11/30/2010)  Lipids   Total Cholesterol: 181  (02/03/2010)   LDL: 118  (  02/03/2010)   LDL Direct: Not documented   HDL: 53.70  (02/03/2010)   Triglycerides: 48.0  (02/03/2010)   Laboratory Results   Urine Tests  Date/Time Received: November 30, 2010 10:18 AM   Routine Urinalysis   Color: yellow Appearance: Clear Glucose: negative   (Normal Range: Negative) Bilirubin: negative   (Normal Range: Negative) Ketone: negative   (Normal Range: Negative) Spec. Gravity: 1.010   (Normal Range: 1.003-1.035) Blood: negative   (Normal Range: Negative) pH: 6.5   (Normal Range: 5.0-8.0) Protein: negative   (Normal Range: Negative) Urobilinogen: 0.2   (Normal Range: 0-1) Nitrite: positive   (Normal Range: Negative) Leukocyte Esterace: large   (Normal Range: Negative)    Comments: Romualdo Bolk, CMA (AAMA)  November 30, 2010 10:18 AM

## 2011-01-14 NOTE — Assessment & Plan Note (Signed)
Summary: iron def.--ch.   History of Present Illness Visit Type: consult Primary GI MD: Melvia Heaps MD [facility] Primary Provider: Berniece Andreas, MD Requesting Provider: Berniece Andreas, MD Chief Complaint: anemia, pt has no GI symptoms History of Present Illness:   Sue Golden  is a pleasant 50 year old American female referred at the request of Dr. Fabian Sharp for evaluation of anemia.  This was noted on routine testing.  in 2001 hemoglobin was 13.4.  From February to April, 2011 hemoglobin has been 11.3-11.9.  Ferritin level is 69.8 and iron saturation is 19.3.  The patient has no GI complaints including change of bowel habits, bowel pain, melena or hematochezia.  She is on no gastric irritants including nonsteroidals.  She is taking supplemental iron.  She has not had a menstrual period since 1994.   GI Review of Systems      Denies abdominal pain, acid reflux, belching, bloating, chest pain, dysphagia with liquids, dysphagia with solids, heartburn, loss of appetite, nausea, vomiting, vomiting blood, weight loss, and  weight gain.        Denies anal fissure, black tarry stools, change in bowel habit, constipation, diarrhea, diverticulosis, fecal incontinence, heme positive stool, hemorrhoids, irritable bowel syndrome, jaundice, light color stool, liver problems, rectal bleeding, and  rectal pain.    Current Medications (verified): 1)  Biotin 10 Mg Tabs (Biotin) 2)  Vitamin D 400 Unit Caps (Cholecalciferol) 3)  Vitamin E 100 Unit Caps (Vitamin E) 4)  Feosol 200 (65 Fe) Mg Tabs (Ferrous Sulfate Dried) .... 2 By Mouth Once Daily 5)  Vitamin C 500 Mg  Tabs (Ascorbic Acid)  Allergies (verified): No Known Drug Allergies  Past History:  Past Medical History: chicken pox as a child G2P2 Anemia  Past Surgical History: Reviewed history from 02/10/2010 and no changes required. Hysterectomy bleeding after childbirth  dec 1994  Appendectomy  Family History: Family History Hypertension mom  Died   01/21/2023 from CVA 37 Family History Diabetes 1st degree relative father  deceased 44 years, siblings siblings health   DM LIPIDs    Neg for anemia No FH of Colon Cancer:  Social History: Single  hh of 2 with son no pets  Former Smoker Alcohol use-no Drug use-no Regular exercise-no works 2 jobs    day job [employer] second job.   65+ hours per  week  Daily Caffeine Use 1 cup coffee  Review of Systems       The patient complains of fatigue and night sweats.  The patient denies allergy/sinus, anemia, anxiety-new, arthritis/joint pain, back pain, blood in urine, breast changes/lumps, confusion, cough, coughing up blood, depression-new, fainting, fever, headaches-new, hearing problems, heart murmur, heart rhythm changes, itching, menstrual pain, muscle pains/cramps, nosebleeds, pregnancy symptoms, shortness of breath, skin rash, sleeping problems, sore throat, swelling of feet/legs, swollen lymph glands, thirst - excessive, urination - excessive, urination changes/pain, urine leakage, vision changes, and voice change.         All other systems were reviewed and were negative   Vital Signs:  Patient profile:   50 year old female Menstrual status:  hysterectomy Height:      65 inches Weight:      165 pounds BMI:     27.56 Pulse rate:   72 / minute Pulse rhythm:   regular BP sitting:   122 / 84  (left arm) Cuff size:   regular  Vitals Entered By: Francee Piccolo CMA Duncan Dull) (Apr 29, 2010 8:34 AM)  Physical Exam  Additional Exam:  On physical exam she is a well-developed well-nourished female  skin: anicteric HEENT: normocephalic; PEERLA; no nasal or pharyngeal abnormalities neck: supple nodes: no cervical lymphadenopathy chest: clear to ausculatation and percussion heart: no murmurs, gallops, or rubs abd: soft, nontender; BS normoactive; no abdominal masses, tenderness, organomegaly rectal: deferred ext: no cynanosis, clubbing, edema skeletal: no deformities neuro:  oriented x 3; no focal abnormalities    Impression & Recommendations:  Problem # 1:  IRON DEFICIENCY (ICD-280.9)  The patient has equivocal iron studies that are suggestive of an iron deficiency anemia.  Occult GI blood loss needs to be ruled out.  Recommendations #1 Hemoccults #2 colonoscopy  Risks, alternatives, and complications of the procedure, including bleeding, perforation, and possible need for surgery, were explained to the patient.  Patient's questions were answered.  Orders: Colonoscopy (Colon)  Patient Instructions: 1)  Copy sent to : Burna Mortimer Panosh,MD 2)  Your Colonoscopy is scheduled for 05/08/2010 at 1:30pm 3)  You can pick up your MoviPrep today 4)  You will hold your Iron 7 days prior to your procedure 5)  The medication list was reviewed and reconciled.  All changed / newly prescribed medications were explained.  A complete medication list was provided to the patient / caregiver. 6)  Colonoscopy and Flexible Sigmoidoscopy brochure given.  7)  Conscious Sedation brochure given.  Prescriptions: MOVIPREP 100 GM  SOLR (PEG-KCL-NACL-NASULF-NA ASC-C) As per prep instructions.  #1 x 0   Entered by:   [name] CMA (AAMA)   Authorized by:   Louis Meckel MD   Signed by:   [name] CMA (AAMA) on 04/29/2010   Method used:   Electronically to        [facility] [phone]* (retail)       [address]       [place], [place]  [address]       Ph: [phone]       Fax: [phone]   RxID:   [id number]

## 2011-01-14 NOTE — Letter (Signed)
Summary: New Patient letter  [facility]  [address] [place], [place] [address]   Phone: [phone]  Fax: [phone]       03/06/2010 MRN: [id number]  [facility] [address] [place] [address] [place], [place]  [address]  Dear Ms. Mulcahey,  Welcome to the Gastroenterology Division at [facility].    You are scheduled to see Dr.  Arlyce Dice on 04-13-10 at 10:45a.m. on the 3rd floor at [facility], [address]  We ask that you try to arrive at our office 15 minutes prior to your appointment time to allow for check-in.  We would like you to complete the enclosed self-administered evaluation form prior to your visit and bring it with you on the day of your appointment.  We will review it with you.  Also, please bring a complete list of all your medications or, if you prefer, bring the medication bottles and we will list them.  Please bring your insurance card so that we may make a copy of it.  If your insurance requires a referral to see a specialist, please bring your referral form from your primary care physician.  Co-payments are due at the time of your visit and may be paid by cash, check or credit card.     Your office visit will consist of a consult with your physician (includes a physical exam), any laboratory testing he/she may order, scheduling of any necessary diagnostic testing (e.g. x-ray, ultrasound, CT-scan), and scheduling of a procedure (e.g. Endoscopy, Colonoscopy) if required.  Please allow enough time on your schedule to allow for any/all of these possibilities.    If you cannot keep your appointment, please call [phone] to cancel or reschedule prior to your appointment date.  This allows [place] the opportunity to schedule an appointment for another patient in need of care.  If you do not cancel or reschedule by 5 p.m. the business day prior to your appointment date, you will be charged a $50.00 late cancellation/no-show fee.    Thank you for choosing  [facility] for your medical needs.  We appreciate the opportunity to care for you.  Please visit [place] at our website  to learn more about our practice.                     Sincerely,                                                             The Gastroenterology Division

## 2011-01-14 NOTE — Assessment & Plan Note (Signed)
Summary: 1 MTH ROV // RS   Vital Signs:  Patient profile:   50 year old female Menstrual status:  hysterectomy Weight:      164 pounds Pulse rate:   88 / minute BP sitting:   130 / 80  (left arm) Cuff size:   regular  Vitals Entered By: Romualdo Bolk, CMA (AAMA) (March 20, 2010 2:46 PM) CC: Follow-up visit on labs   History of Present Illness: Sue Golden comes in for follow up of her anemia and low wbc.   She has had no change in med status and does have an appt with GI. No excessive  bruising bleeding . No gi co now. NO swollen glands fevers or weight loss. Is not taking asa or frequent nsaids .   Doing faily well has stresses but coping.  Preventive Screening-Counseling & Management  Alcohol-Tobacco     Alcohol drinks/day: 0     Smoking Status: quit     Year Quit: 1999  Caffeine-Diet-Exercise     Caffeine use/day: 1     Does Patient Exercise: no  Current Medications (verified): 1)  Biotin 10 Mg Tabs (Biotin) 2)  Vitamin D 400 Unit Caps (Cholecalciferol) 3)  Vitamin E 100 Unit Caps (Vitamin E) 4)  Feosol 200 (65 Fe) Mg Tabs (Ferrous Sulfate Dried) .... 2 By Mouth Once Daily 5)  Vitamin C 500 Mg  Tabs (Ascorbic Acid)  Allergies (verified): No Known Drug Allergies  Past History:  Past Medical History: Unremarkable chicken pox as a child G2P2   No repeated anemia hx   Past History:  Care Management: Dermatology: Joseph Art  Family History: Family History Hypertension mom  Died  01-05-23 from CVA 64 Family History Diabetes 1st degree relative father  deceased 42 years  siblings health   DM LIPIDs    Neg colon polyps of cancer .   Neg for anemia  Review of Systems       no change LAst visit  no uti   Physical Exam  General:  Well-developed,well-nourished,in no acute distress; alert,appropriate and cooperative throughout examination Head:  normocephalic and atraumatic.   Mouth:  pharynx pink and moist.   Neck:  No deformities, masses, or tenderness  noted. Lungs:  Normal respiratory effort, chest expands symmetrically. Lungs are clear to auscultation, no crackles or wheezes. Heart:  Normal rate and regular rhythm. S1 and S2 normal without gallop, murmur, click, rub or other extra sounds. Abdomen:  Bowel sounds positive,abdomen soft and non-tender without masses, organomegaly or  s noted. Skin:  turgor normal, color normal, no petechiae, and no purpura.   Cervical Nodes:  No lymphadenopathy noted Axillary Nodes:  No palpable lymphadenopathy Inguinal Nodes:  No significant adenopathy Psych:  Oriented X3, good eye contact, not anxious appearing, and not depressed appearing.     Impression & Recommendations:  Problem # 1:  IRON DEFICIENCY (ICD-280.9) mild  without obv cause  .   disc and reasoning for Go consult .  continue iron for now. Her updated medication list for this problem includes:    Feosol 200 (65 Fe) Mg Tabs (Ferrous sulfate dried) .Marland Kitchen... 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  Complete Medication List: 1)  Biotin 10 Mg Tabs (Biotin) 2)  Vitamin D 400 Unit Caps (Cholecalciferol) 3)  Vitamin E 100 Unit Caps (Vitamin e) 4)  Feosol 200 (65 Fe) Mg Tabs (Ferrous  sulfate dried) .... 2 by mouth once daily 5)  Vitamin C 500 Mg Tabs (Ascorbic acid)  Other Orders: TLB-CBC Platelet - w/Differential ([id number]) Venipuncture ([id number])  Patient Instructions: 1)  You will be informed of lab results when available.  2)  then plan follow up  3)  Keep the appt with GI doctor.

## 2011-04-27 NOTE — Procedures (Signed)
LOWER EXTREMITY VENOUS REFLUX EXAM   INDICATION:  Bilateral telangiectasia.   EXAM:  Using color-flow imaging and pulse Doppler spectral analysis, the  right and left common femoral, superficial femoral, popliteal, posterior  tibial, greater and lesser saphenous veins are evaluated.  There is no  evidence suggesting deep venous insufficiency in the right or left lower  extremity.   The right and left saphenofemoral junctions are competent.  The right  and left GSVs are competent.  The left proximal short saphenous vein  demonstrates incompetency.   GSV Diameter (used if found to be incompetent only)                                            Right    Left  Proximal Greater Saphenous Vein           cm       cm  Proximal-to-mid-thigh                     cm       cm  Mid thigh                                 cm       cm  Mid-distal thigh                          cm       cm  Distal thigh                              cm       cm  Knee                                      cm       cm   IMPRESSION:  1. No right or left greater saphenous vein reflux is identified.  2. The right and left greater saphenous veins are not aneurysmal.  3. The right and left greater saphenous veins are not tortuous.  4. The deep venous system is competent.  5. The right lesser saphenous vein is competent.  6. 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  Within this      cluster of veins there appears to be at least one vein which could      be considered a tortuous short saphenous vein.    ___________________________________________  Larina Earthly, M.D.   MC/MEDQ  D:  05/07/2009  T:  05/07/2009  Job:  [id number]

## 2011-04-27 NOTE — Consult Note (Signed)
NEW PATIENT CONSULTATION   Sue Golden, ODLE D  DOB:  01/06/1961                                       05/07/2009  [id number]#:[id number]   This patient presents today for evaluation of venous pathology.  She is  a very pleasant 47-year black female who initially was seen by an  outlying vein center for concern regarding telangiectasia of her thighs.  She reports no specific pain of her legs but does report that after  working 2 jobs that her legs are tired at the end of the day.  On  further evaluation at the vein center she was felt to have a significant  reflux and apparently was offered bilateral staged of laser ablation.  This was in preparation for treatment of the telangiectasia.  She denies  any prior history of deep venous thrombosis, does not have any pain  specifically over the telangiectasia.  She does report no significant  swelling but does have some tired achiness at the end of the day.   PAST HISTORY:  Otherwise remarkable with no major medical difficulties.   She is single with one child.  She does not smoke, having quit in 1999.  She does not drink alcohol.   She is on no meds.   PHYSICAL EXAMINATION:  A well-nourished, well-developed, black female  appearing stated age of 67.  She has 2+ dorsalis pedis pulses  bilaterally.  She does not have any swelling.  She does have mild  telangiectasia on anterior thighs and lateral calves.  She does have  periods several small reticular varicosities in her left popliteal  space.  She does not have any skin changes of chronic venous  hypertension.   She underwent formal duplex evaluation in our office.  This shows no  evidence of deep femoral reflux.  She does not have any evidence of  reflux at her saphenofemoral junction or in her great saphenous veins  bilaterally.   I discussed this at length with the patient.  I explained that we were  unable to identify anything that we would recommend ablation for  since  we could not demonstrate any reflux in her great saphenous vein.  I did  explain the option of sclerotherapy for cosmetic reasons for her  telangiectasia and explained the procedure and approximate out-of-pocket  expense she could expect.  I explained this does not have any  relationship to more serious medical complications.  I do not see any  evidence of reflux to explain her tired achiness and she understands  fully that this is quite common with or without venous pathology at the  end of a long day of standing.  She was comfortable at this discussion  and will notify us should she wish to proceed with further treatment of  her telangiectasia.   Larina Earthly, M.D.  Electronically Signed   TFE/MEDQ  D:  05/07/2009  T:  05/08/2009  Job:  [id number]   cc:   Neta Mends. Fabian Sharp, MD

## 2011-07-14 ENCOUNTER — Encounter: Payer: Self-pay | Admitting: Podiatrist

## 2011-07-14 DIAGNOSIS — M7750 Other enthesopathy of unspecified foot: Secondary | ICD-10-CM | POA: Insufficient documentation

## 2011-08-08 ENCOUNTER — Emergency Department (HOSPITAL_COMMUNITY): Payer: BC Managed Care – PPO

## 2011-08-08 ENCOUNTER — Emergency Department (HOSPITAL_COMMUNITY)
Admission: EM | Admit: 2011-08-08 | Discharge: 2011-08-09 | Disposition: A | Discharge status: Home / Self Care | Payer: BC Managed Care – PPO | Attending: Emergency Medicine | Admitting: Emergency Medicine

## 2011-08-08 DIAGNOSIS — K5900 Constipation, unspecified: Secondary | ICD-10-CM | POA: Insufficient documentation

## 2011-08-08 DIAGNOSIS — R109 Unspecified abdominal pain: Secondary | ICD-10-CM | POA: Insufficient documentation

## 2012-10-24 ENCOUNTER — Other Ambulatory Visit (INDEPENDENT_AMBULATORY_CARE_PROVIDER_SITE_OTHER): Payer: BC Managed Care – PPO

## 2012-10-24 DIAGNOSIS — Z0000 Encounter for general adult medical examination without abnormal findings: Secondary | ICD-10-CM

## 2012-10-24 LAB — HEPATIC FUNCTION PANEL
AST: 17 U/L (ref 0–37)
Albumin: 3.9 g/dL (ref 3.5–5.2)
Alkaline Phosphatase: 71 U/L (ref 39–117)
Bilirubin, Direct: 0.1 mg/dL (ref 0.0–0.3)
Total Protein: 7.9 g/dL (ref 6.0–8.3)

## 2012-10-24 LAB — POCT URINALYSIS DIPSTICK
Blood, UA: NEGATIVE
Glucose, UA: NEGATIVE
Ketones, UA: NEGATIVE
Protein, UA: NEGATIVE
Spec Grav, UA: 1.015
Urobilinogen, UA: 0.2

## 2012-10-24 LAB — CBC WITH DIFFERENTIAL/PLATELET
Basophils Relative: 0.3 % (ref 0.0–3.0)
Eosinophils Absolute: 0.1 10*3/uL (ref 0.0–0.7)
Eosinophils Relative: 2.1 % (ref 0.0–5.0)
Hemoglobin: 12.6 g/dL (ref 12.0–15.0)
Lymphocytes Relative: 63.7 % — ABNORMAL HIGH (ref 12.0–46.0)
Monocytes Relative: 10.7 % (ref 3.0–12.0)
Neutro Abs: 0.8 10*3/uL — ABNORMAL LOW (ref 1.4–7.7)
Neutrophils Relative %: 23.2 % — ABNORMAL LOW (ref 43.0–77.0)
RBC: 4.15 Mil/uL (ref 3.87–5.11)
WBC: 3.3 10*3/uL — ABNORMAL LOW (ref 4.5–10.5)

## 2012-10-24 LAB — BASIC METABOLIC PANEL
CO2: 28 mEq/L (ref 19–32)
Calcium: 9.9 mg/dL (ref 8.4–10.5)
Creatinine, Ser: 0.8 mg/dL (ref 0.4–1.2)
GFR: 92.8 mL/min (ref >60.00)
Sodium: 139 mEq/L (ref 135–145)

## 2012-10-24 LAB — LDL CHOLESTEROL, DIRECT: Direct LDL: 131.2 mg/dL

## 2012-10-24 LAB — LIPID PANEL
Cholesterol: 206 mg/dL — ABNORMAL HIGH (ref 0–200)
Total CHOL/HDL Ratio: 3
Triglycerides: 49 mg/dL (ref 0.0–149.0)
VLDL: 9.8 mg/dL (ref 0.0–40.0)

## 2012-10-31 ENCOUNTER — Encounter: Payer: Self-pay | Admitting: Internal Medicine

## 2012-10-31 ENCOUNTER — Ambulatory Visit (INDEPENDENT_AMBULATORY_CARE_PROVIDER_SITE_OTHER): Payer: BC Managed Care – PPO | Admitting: Internal Medicine

## 2012-10-31 VITALS — BP 158/98 | HR 80 | Temp 97.9°F | Ht 64.5 in | Wt 162.0 lb

## 2012-10-31 DIAGNOSIS — Z0000 Encounter for general adult medical examination without abnormal findings: Secondary | ICD-10-CM

## 2012-10-31 DIAGNOSIS — D72819 Decreased white blood cell count, unspecified: Secondary | ICD-10-CM

## 2012-10-31 DIAGNOSIS — R82998 Other abnormal findings in urine: Secondary | ICD-10-CM

## 2012-10-31 DIAGNOSIS — Z638 Other specified problems related to primary support group: Secondary | ICD-10-CM

## 2012-10-31 DIAGNOSIS — R030 Elevated blood-pressure reading, without diagnosis of hypertension: Secondary | ICD-10-CM

## 2012-10-31 DIAGNOSIS — I10 Essential (primary) hypertension: Secondary | ICD-10-CM

## 2012-10-31 DIAGNOSIS — Z90710 Acquired absence of both cervix and uterus: Secondary | ICD-10-CM

## 2012-10-31 DIAGNOSIS — Z6379 Other stressful life events affecting family and household: Secondary | ICD-10-CM

## 2012-10-31 MED ORDER — SULFAMETHOXAZOLE-TRIMETHOPRIM 800-160 MG PO TABS: 1.0000 | ORAL_TABLET | Freq: Two times a day (BID) | ORAL | Status: DC

## 2012-10-31 NOTE — Patient Instructions (Addendum)
Continue lifestyle intervention healthy eating and exercise . Your exam is normal today except your blood pressure Get a mammogram. Add exercise such as walking   And yoga.   Untreated   High blood pressure is a risk for  Stroke and heart attack and heart failure . Also reduce stress. Advise repeat CBC in about 3 months  And if stable can go yearly  Recheck if Bp readings are  Increased  For evaluation and management.   You may have a uti and we can treat and if continues then recheck.  DASH Diet The DASH diet stands for "Dietary Approaches to Stop Hypertension." It is a healthy eating plan that has been shown to reduce high blood pressure (hypertension) in as little as 14 days, while also possibly providing other significant health benefits. These other health benefits include reducing the risk of breast cancer after menopause and reducing the risk of type 2 diabetes, heart disease, colon cancer, and stroke. Health benefits also include weight loss and slowing kidney failure in patients with chronic kidney disease.  DIET GUIDELINES  Limit salt (sodium). Your diet should contain less than 1500 mg of sodium daily.  Limit refined or processed carbohydrates. Your diet should include mostly whole grains. Desserts and added sugars should be used sparingly.  Include small amounts of heart-healthy fats. These types of fats include nuts, oils, and tub margarine. Limit saturated and trans fats. These fats have been shown to be harmful in the body. CHOOSING FOODS  The following food groups are based on a 2000 calorie diet. See your Registered Dietitian for individual calorie needs. Grains and Grain Products (6 to 8 servings daily)  Eat More Often: Whole-wheat bread, brown rice, whole-grain or wheat pasta, quinoa, popcorn without added fat or salt (air popped).  Eat Less Often: White bread, white pasta, white rice, cornbread. Vegetables (4 to 5 servings daily)  Eat More Often: Fresh, frozen, and  canned vegetables. Vegetables may be raw, steamed, roasted, or grilled with a minimal amount of fat.  Eat Less Often/Avoid: Creamed or fried vegetables. Vegetables in a cheese sauce. Fruit (4 to 5 servings daily)  Eat More Often: All fresh, canned (in natural juice), or frozen fruits. Dried fruits without added sugar. One hundred percent fruit juice ( cup [237 mL] daily).  Eat Less Often: Dried fruits with added sugar. Canned fruit in light or heavy syrup. [employer], Fish, and Poultry (2 servings or less daily. One serving is 3 to 4 oz [85-114 g]).  Eat More Often: Ninety percent or leaner ground beef, tenderloin, sirloin. Round cuts of beef, chicken breast, [place] breast. All fish. Grill, bake, or broil your meat. Nothing should be fried.  Eat Less Often/Avoid: Fatty cuts of meat, [place], or chicken leg, thigh, or wing. Fried cuts of meat or fish. Dairy (2 to 3 servings)  Eat More Often: Low-fat or fat-free milk, low-fat plain or light yogurt, reduced-fat or part-skim cheese.  Eat Less Often/Avoid: Milk (whole, 2%).Whole milk yogurt. Full-fat cheeses. Nuts, Seeds, and Legumes (4 to 5 servings per week)  Eat More Often: All without added salt.  Eat Less Often/Avoid: Salted nuts and seeds, canned beans with added salt. Fats and Sweets (limited)  Eat More Often: Vegetable oils, tub margarines without trans fats, sugar-free gelatin. Mayonnaise and salad dressings.  Eat Less Often/Avoid: Coconut oils, palm oils, butter, stick margarine, cream, half and half, cookies, candy, pie. FOR MORE INFORMATION The Dash Diet Eating Plan: www.dashdiet.org Document Released: 11/18/2011 Document Revised: 02/21/2012  Document Reviewed: 11/18/2011 [facility] Patient Information 2013 [place], [place].   How to Take Your Blood Pressure  These instructions are only for electronic home blood pressure machines. You will need:   An automatic or semi-automatic blood pressure machine.  Fresh batteries for  the blood pressure machine. HOW DO I USE THESE TOOLS TO CHECK MY BLOOD PRESSURE?   There are 2 numbers that make up your blood pressure. For example: 120/80.  The first number (120 in our example) is called the "systolic pressure." It is a measure of the pressure in your blood vessels when your heart is pumping blood.  The second number (80 in our example) is called the "diastolic pressure." It is a measure of the pressure in your blood vessels when your heart is resting between beats.  Before you buy a home blood pressure machine, check the size of your arm so you can buy the right size cuff. Here is how to check the size of your arm:  Use a tape measure that shows both inches and centimeters.  Wrap the tape measure around the middle upper part of your arm. You may need someone to help you measure right.  Write down your arm measurement in both inches and centimeters.  To measure your blood pressure right, it is important to have the right size cuff.  If your arm is up to 13 inches (37 to 34 centimeters), get an adult cuff size.  If your arm is 13 to 17 inches (35 to 44 centimeters), get a large adult cuff size.  If your arm is 17 to 20 inches (45 to 52 centimeters), get an adult thigh cuff.  Try to rest or relax for at least 30 minutes before you check your blood pressure.  Do not smoke.  Do not have any drinks with caffeine, such as:  Pop.  Coffee.  Tea.  Check your blood pressure in a quiet room.  Sit down and stretch out your arm on a table. Keep your arm at about the level of your heart. Let your arm relax. GETTING BLOOD PRESSURE READINGS  Make sure you remove any tight-fighting clothing from your arm. Wrap the cuff around your upper arm. Wrap it just above the bend, and above where you felt the pulse. You should be able to slip a finger between the cuff and your arm. If you cannot slip a finger in the cuff, it is too tight and should be removed and rewrapped.  Some  units requires you to manually pump up the arm cuff.  Automatic units inflate the cuff when you press a button.  Cuff deflation is automatic in both models.  After the cuff is inflated, the unit measures your blood pressure and pulse. The readings are displayed on a monitor. Hold still and breathe normally while the cuff is inflated.  Getting a reading takes less than a minute.  Some models store readings in a memory. Some provide a printout of readings.  Get readings at different times of the day. You should wait at least 5 minutes between readings. Take readings with you to your next doctor's visit. Document Released: 11/11/2008 Document Revised: 02/21/2012 Document Reviewed: 11/11/2008 [facility] Patient Information 2013 [place], [place].   Preventive Care for Adults, Female A healthy lifestyle and preventive care can promote health and wellness. Preventive health guidelines for women include the following key practices.  A routine yearly physical is a good way to check with your caregiver about your health and preventive screening. It  is a chance to share any concerns and updates on your health, and to receive a thorough exam.  Visit your dentist for a routine exam and preventive care every 6 months. Brush your teeth twice a day and floss once a day. Good oral hygiene prevents tooth decay and gum disease.  The frequency of eye exams is based on your age, health, family medical history, use of contact lenses, and other factors. Follow your caregiver's recommendations for frequency of eye exams.  Eat a healthy diet. Foods like vegetables, fruits, whole grains, low-fat dairy products, and lean protein foods contain the nutrients you need without too many calories. Decrease your intake of foods high in solid fats, added sugars, and salt. Eat the right amount of calories for you.Get information about a proper diet from your caregiver, if necessary.  Regular physical exercise is one of the  most important things you can do for your health. Most adults should get at least 150 minutes of moderate-intensity exercise (any activity that increases your heart rate and causes you to sweat) each week. In addition, most adults need muscle-strengthening exercises on 2 or more days a week.  Maintain a healthy weight. The body mass index (BMI) is a screening tool to identify possible weight problems. It provides an estimate of body fat based on height and weight. Your caregiver can help determine your BMI, and can help you achieve or maintain a healthy weight.For adults 20 years and older:  A BMI below 18.5 is considered underweight.  A BMI of 18.5 to 24.9 is normal.  A BMI of 25 to 29.9 is considered overweight.  A BMI of 30 and above is considered obese.  Maintain normal blood lipids and cholesterol levels by exercising and minimizing your intake of saturated fat. Eat a balanced diet with plenty of fruit and vegetables. Blood tests for lipids and cholesterol should begin at age 84 and be repeated every 5 years. If your lipid or cholesterol levels are high, you are over 50, or you are at high risk for heart disease, you may need your cholesterol levels checked more frequently.Ongoing high lipid and cholesterol levels should be treated with medicines if diet and exercise are not effective.  If you smoke, find out from your caregiver how to quit. If you do not use tobacco, do not start.  If you are pregnant, do not drink alcohol. If you are breastfeeding, be very cautious about drinking alcohol. If you are not pregnant and choose to drink alcohol, do not exceed 1 drink per day. One drink is considered to be 12 ounces (355 mL) of beer, 5 ounces (148 mL) of wine, or 1.5 ounces (44 mL) of liquor.  Avoid use of street drugs. Do not share needles with anyone. Ask for help if you need support or instructions about stopping the use of drugs.  High blood pressure causes heart disease and increases the  risk of stroke. Your blood pressure should be checked at least every 1 to 2 years. Ongoing high blood pressure should be treated with medicines if weight loss and exercise are not effective.  If you are 51 to 52 years old, ask your caregiver if you should take aspirin to prevent strokes.  Diabetes screening involves taking a blood sample to check your fasting blood sugar level. This should be done once every 3 years, after age 20, if you are within normal weight and without risk factors for diabetes. Testing should be considered at a younger age or be  carried out more frequently if you are overweight and have at least 1 risk factor for diabetes.  Breast cancer screening is essential preventive care for women. You should practice "breast self-awareness." This means understanding the normal appearance and feel of your breasts and may include breast self-examination. Any changes detected, no matter how small, should be reported to a caregiver. Women in their 53s and 30s should have a clinical breast exam (CBE) by a caregiver as part of a regular health exam every 1 to 3 years. After age 51, women should have a CBE every year. Starting at age 70, women should consider having a mammography (breast X-ray test) every year. Women who have a family history of breast cancer should talk to their caregiver about genetic screening. Women at a high risk of breast cancer should talk to their caregivers about having magnetic resonance imaging (MRI) and a mammography every year.  The Pap test is a screening test for cervical cancer. A Pap test can show cell changes on the cervix that might become cervical cancer if left untreated. A Pap test is a procedure in which cells are obtained and examined from the lower end of the uterus (cervix).  Women should have a Pap test starting at age 19.  Between ages 9 and 63, Pap tests should be repeated every 2 years.  Beginning at age 54, you should have a Pap test every 3 years  as long as the past 3 Pap tests have been normal.  Some women have medical problems that increase the chance of getting cervical cancer. Talk to your caregiver about these problems. It is especially important to talk to your caregiver if a new problem develops soon after your last Pap test. In these cases, your caregiver may recommend more frequent screening and Pap tests.  The above recommendations are the same for women who have or have not gotten the vaccine for human papillomavirus (HPV).  If you had a hysterectomy for a problem that was not cancer or a condition that could lead to cancer, then you no longer need Pap tests. Even if you no longer need a Pap test, a regular exam is a good idea to make sure no other problems are starting.  If you are between ages 44 and 28, and you have had normal Pap tests going back 10 years, you no longer need Pap tests. Even if you no longer need a Pap test, a regular exam is a good idea to make sure no other problems are starting.  If you have had past treatment for cervical cancer or a condition that could lead to cancer, you need Pap tests and screening for cancer for at least 20 years after your treatment.  If Pap tests have been discontinued, risk factors (such as a new sexual partner) need to be reassessed to determine if screening should be resumed.  The HPV test is an additional test that may be used for cervical cancer screening. The HPV test looks for the virus that can cause the cell changes on the cervix. The cells collected during the Pap test can be tested for HPV. The HPV test could be used to screen women aged 48 years and older, and should be used in women of any age who have unclear Pap test results. After the age of 49, women should have HPV testing at the same frequency as a Pap test.  Colorectal cancer can be detected and often prevented. Most routine colorectal cancer screening begins at  the age of 55 and continues through age 40. However,  your caregiver may recommend screening at an earlier age if you have risk factors for colon cancer. On a yearly basis, your caregiver may provide home test kits to check for hidden blood in the stool. Use of a small camera at the end of a tube, to directly examine the colon (sigmoidoscopy or colonoscopy), can detect the earliest forms of colorectal cancer. Talk to your caregiver about this at age 24, when routine screening begins. Direct examination of the colon should be repeated every 5 to 10 years through age 82, unless early forms of pre-cancerous polyps or small growths are found.  Hepatitis C blood testing is recommended for all people born from [birth year] through 1965 and any individual with known risks for hepatitis C.  Practice safe sex. Use condoms and avoid high-risk sexual practices to reduce the spread of sexually transmitted infections (STIs). STIs include gonorrhea, chlamydia, syphilis, trichomonas, herpes, HPV, and human immunodeficiency virus (HIV). Herpes, HIV, and HPV are viral illnesses that have no cure. They can result in disability, cancer, and death. Sexually active women aged 64 and younger should be checked for chlamydia. Older women with new or multiple partners should also be tested for chlamydia. Testing for other STIs is recommended if you are sexually active and at increased risk.  Osteoporosis is a disease in which the bones lose minerals and strength with aging. This can result in serious bone fractures. The risk of osteoporosis can be identified using a bone density scan. Women ages 52 and over and women at risk for fractures or osteoporosis should discuss screening with their caregivers. Ask your caregiver whether you should take a calcium supplement or vitamin D to reduce the rate of osteoporosis.  Menopause can be associated with physical symptoms and risks. Hormone replacement therapy is available to decrease symptoms and risks. You should talk to your caregiver about whether  hormone replacement therapy is right for you.  Use sunscreen with sun protection factor (SPF) of 30 or more. Apply sunscreen liberally and repeatedly throughout the day. You should seek shade when your shadow is shorter than you. Protect yourself by wearing long sleeves, pants, a wide-brimmed hat, and sunglasses year round, whenever you are outdoors.  Once a month, do a whole body skin exam, using a mirror to look at the skin on your back. Notify your caregiver of new moles, moles that have irregular borders, moles that are larger than a pencil eraser, or moles that have changed in shape or color.  Stay current with required immunizations.  Influenza. You need a dose every fall (or winter). The composition of the flu vaccine changes each year, so being vaccinated once is not enough.  Pneumococcal polysaccharide. You need 1 to 2 doses if you smoke cigarettes or if you have certain chronic medical conditions. You need 1 dose at age 99 (or older) if you have never been vaccinated.  Tetanus, diphtheria, pertussis (Tdap, Td). Get 1 dose of Tdap vaccine if you are younger than age 84, are over 81 and have contact with an infant, are a Research scientist (physical sciences), are pregnant, or simply want to be protected from whooping cough. After that, you need a Td booster dose every 10 years. Consult your caregiver if you have not had at least 3 tetanus and diphtheria-containing shots sometime in your life or have a deep or dirty wound.  HPV. You need this vaccine if you are a woman age 42 or younger. The  vaccine is given in 3 doses over 6 months.  Measles, mumps, rubella (MMR). You need at least 1 dose of MMR if you were born in 1957 or later. You may also need a second dose.  Meningococcal. If you are age 6 to 41 and a first-year college student living in a residence hall, or have one of several medical conditions, you need to get vaccinated against meningococcal disease. You may also need additional booster  doses.  Zoster (shingles). If you are age 19 or older, you should get this vaccine.  Varicella (chickenpox). If you have never had chickenpox or you were vaccinated but received only 1 dose, talk to your caregiver to find out if you need this vaccine.  Hepatitis A. You need this vaccine if you have a specific risk factor for hepatitis A virus infection or you simply wish to be protected from this disease. The vaccine is usually given as 2 doses, 6 to 18 months apart.  Hepatitis B. You need this vaccine if you have a specific risk factor for hepatitis B virus infection or you simply wish to be protected from this disease. The vaccine is given in 3 doses, usually over 6 months. Preventive Services / Frequency Ages 23 to 79  Blood pressure check.** / Every 1 to 2 years.  Lipid and cholesterol check.** / Every 5 years beginning at age 68.  Clinical breast exam.** / Every 3 years for women in their 57s and 30s.  Pap test.** / Every 2 years from ages 38 through 58. Every 3 years starting at age 91 through age 61 or 56 with a history of 3 consecutive normal Pap tests.  HPV screening.** / Every 3 years from ages 22 through ages 32 to 25 with a history of 3 consecutive normal Pap tests.  Hepatitis C blood test.** / For any individual with known risks for hepatitis C.  Skin self-exam. / Monthly.  Influenza immunization.** / Every year.  Pneumococcal polysaccharide immunization.** / 1 to 2 doses if you smoke cigarettes or if you have certain chronic medical conditions.  Tetanus, diphtheria, pertussis (Tdap, Td) immunization. / A one-time dose of Tdap vaccine. After that, you need a Td booster dose every 10 years.  HPV immunization. / 3 doses over 6 months, if you are 56 and younger.  Measles, mumps, rubella (MMR) immunization. / You need at least 1 dose of MMR if you were born in 1957 or later. You may also need a second dose.  Meningococcal immunization. / 1 dose if you are age 70 to 51 and  a first-year college student living in a residence hall, or have one of several medical conditions, you need to get vaccinated against meningococcal disease. You may also need additional booster doses.  Varicella immunization.** / Consult your caregiver.  Hepatitis A immunization.** / Consult your caregiver. 2 doses, 6 to 18 months apart.  Hepatitis B immunization.** / Consult your caregiver. 3 doses usually over 6 months. Ages 70 to 1  Blood pressure check.** / Every 1 to 2 years.  Lipid and cholesterol check.** / Every 5 years beginning at age 27.  Clinical breast exam.** / Every year after age 35.  Mammogram.** / Every year beginning at age 21 and continuing for as long as you are in good health. Consult with your caregiver.  Pap test.** / Every 3 years starting at age 58 through age 66 or 29 with a history of 3 consecutive normal Pap tests.  HPV screening.** / Every 3  years from ages 94 through ages 46 to 51 with a history of 3 consecutive normal Pap tests.  Fecal occult blood test (FOBT) of stool. / Every year beginning at age 30 and continuing until age 26. You may not need to do this test if you get a colonoscopy every 10 years.  Flexible sigmoidoscopy or colonoscopy.** / Every 5 years for a flexible sigmoidoscopy or every 10 years for a colonoscopy beginning at age 6 and continuing until age 45.  Hepatitis C blood test.** / For all people born from [birth year] through 1966 and any individual with known risks for hepatitis C.  Skin self-exam. / Monthly.  Influenza immunization.** / Every year.  Pneumococcal polysaccharide immunization.** / 1 to 2 doses if you smoke cigarettes or if you have certain chronic medical conditions.  Tetanus, diphtheria, pertussis (Tdap, Td) immunization.** / A one-time dose of Tdap vaccine. After that, you need a Td booster dose every 10 years.  Measles, mumps, rubella (MMR) immunization. / You need at least 1 dose of MMR if you were born in 1957 or  later. You may also need a second dose.  Varicella immunization.** / Consult your caregiver.  Meningococcal immunization.** / Consult your caregiver.  Hepatitis A immunization.** / Consult your caregiver. 2 doses, 6 to 18 months apart.  Hepatitis B immunization.** / Consult your caregiver. 3 doses, usually over 6 months. Ages 59 and over  Blood pressure check.** / Every 1 to 2 years.  Lipid and cholesterol check.** / Every 5 years beginning at age 67.  Clinical breast exam.** / Every year after age 39.  Mammogram.** / Every year beginning at age 22 and continuing for as long as you are in good health. Consult with your caregiver.  Pap test.** / Every 3 years starting at age 59 through age 6 or 32 with a 3 consecutive normal Pap tests. Testing can be stopped between 65 and 70 with 3 consecutive normal Pap tests and no abnormal Pap or HPV tests in the past 10 years.  HPV screening.** / Every 3 years from ages 59 through ages 58 or 53 with a history of 3 consecutive normal Pap tests. Testing can be stopped between 65 and 70 with 3 consecutive normal Pap tests and no abnormal Pap or HPV tests in the past 10 years.  Fecal occult blood test (FOBT) of stool. / Every year beginning at age 59 and continuing until age 19. You may not need to do this test if you get a colonoscopy every 10 years.  Flexible sigmoidoscopy or colonoscopy.** / Every 5 years for a flexible sigmoidoscopy or every 10 years for a colonoscopy beginning at age 42 and continuing until age 59.  Hepatitis C blood test.** / For all people born from [birth year] through 1966 and any individual with known risks for hepatitis C.  Osteoporosis screening.** / A one-time screening for women ages 78 and over and women at risk for fractures or osteoporosis.  Skin self-exam. / Monthly.  Influenza immunization.** / Every year.  Pneumococcal polysaccharide immunization.** / 1 dose at age 52 (or older) if you have never been  vaccinated.  Tetanus, diphtheria, pertussis (Tdap, Td) immunization. / A one-time dose of Tdap vaccine if you are over 65 and have contact with an infant, are a Research scientist (physical sciences), or simply want to be protected from whooping cough. After that, you need a Td booster dose every 10 years.  Varicella immunization.** / Consult your caregiver.  Meningococcal immunization.** / Consult your caregiver.  Hepatitis A immunization.** / Consult your caregiver. 2 doses, 6 to 18 months apart.  Hepatitis B immunization.** / Check with your caregiver. 3 doses, usually over 6 months. ** Family history and personal history of risk and conditions may change your caregiver's recommendations. Document Released: 01/25/2002 Document Revised: 02/21/2012 Document Reviewed: 04/26/2011 [facility] [facility] 2013 [place], [place]

## 2012-10-31 NOTE — Progress Notes (Signed)
Chief Complaint  Patient presents with  . Annual Exam    HPI: Patient comes in today for Preventive Health Care visit  No major change in health status since last visit . Has had slightly elevated  bp at the chiro. No cp sob . On close questioning has urinary frequency and in past rx for uti made it better but uncertain if has uti. Has had a hysterectomy ? If has VMsx off and on .  ROS:  GEN/ HEENT: No fever, significant weight changes sweats headaches vision problems hearing changes, CV/ PULM; No chest pain shortness of breath cough, syncope,edema  change in exercise tolerance. GI /GU: No adominal pain, vomiting, change in bowel habits. No blood in the stool. SKIN/HEME: ,no acute skin rashes suspicious lesions or bleeding. No lymphadenopathy, nodules, masses.  NEURO/ PSYCH:  No neurologic signs such as weakness numbness. No depression anxiety. IMM/ Allergy: No unusual infections.  Allergy .   REST of 12 system review negative except as per HPI   Past Medical History  Diagnosis Date  . Anemia   . Chicken pox     Family History  Problem Relation Age of Onset  . Hypertension Mother   . Diabetes Father     History   Social History  . Marital Status: Single    Spouse Name: N/A    Number of Children: N/A  . Years of Education: N/A   Social History Main Topics  . Smoking status: Former Games developer  . Smokeless tobacco: None  . Alcohol Use: No  . Drug Use: None  . Sexually Active: None   Other Topics Concern  . 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 No tobaccocaffiene in am ETOh.   No meds at this time   EXAM:  BP 158/98  Pulse 80  Temp 97.9 F (36.6 C) (Oral)  Ht 5' 4.5" (1.638 m)  Wt 162 lb (73.483 kg)  BMI 27.38 kg/m2  Body mass index is 27.38 kg/(m^2).  Physical Exam: Vital signs reviewed [id number] is a well-developed well-nourished alert cooperative   female who appears her stated age in  no acute distress.  HEENT: normocephalic atraumatic , Eyes: PERRL EOM's full, conjunctiva clear, Nares: paten,t no deformity discharge or tenderness., Ears: no deformity EAC's clear TMs with normal landmarks. Mouth: clear OP, no lesions, edema.  Moist mucous membranes. Dentition in adequate repair. NECK: supple without masses, thyromegaly or bruits. CHEST/PULM:  Clear to auscultation and percussion breath sounds equal no wheeze , rales or rhonchi. No chest wall deformities or tenderness. Breast: normal by inspection . No dimpling, discharge, masses, tenderness or discharge . CV: PMI is nondisplaced, S1 S2 no gallops, murmurs, rubs. Peripheral pulses are full without delay.No JVD .  ABDOMEN: Bowel sounds normal nontender  No guard or rebound, no hepato splenomegal no CVA tenderness.  No hernia. Extremtities:  No clubbing cyanosis or edema, no acute joint swelling or redness no focal atrophy NEURO:  Oriented x3, cranial nerves 3-12 appear to be intact, no obvious focal weakness,gait within normal limits no abnormal reflexes or asymmetrical SKIN: No acute rashes normal turgor, color, no bruising or petechiae. PSYCH: Oriented, good eye contact, no obvious depression anxiety, cognition and judgment appear normal. LN: no cervical axillary inguinal adenopathy  Lab Results  Component Value Date   WBC 3.3* 10/24/2012   HGB 12.6 10/24/2012   HCT 37.9 10/24/2012   PLT 208.0 10/24/2012  GLUCOSE 94 10/24/2012   CHOL 206* 10/24/2012   TRIG 49.0 10/24/2012   HDL 65.30 10/24/2012   LDLDIRECT 131.2 10/24/2012   LDLCALC 118* 02/03/2010   ALT 16 10/24/2012   AST 17 10/24/2012   NA 139 10/24/2012   K 5.0 10/24/2012   CL 106 10/24/2012   CREATININE 0.8 10/24/2012   BUN 11 10/24/2012   CO2 28 10/24/2012   TSH 1.40 10/24/2012    ASSESSMENT AND PLAN:  Discussed the following assessment and plan:  1. Visit for preventive health examination   2. LEUKOPENIA    lower than last year exam and hx [place]  repeat in a few months  3. URINALYSIS, ABNORMAL    poss uti  mild sx  empiric rx  for now and fu if needed  4. Elevated blood pressure reading    may need med intervention pt wishes LSI first  5. Hypertension   6. Stressful life event affecting family   7. H/O hysterectomy for benign disease      Patient Instructions  Continue lifestyle intervention healthy eating and exercise . Your exam is normal today except your blood pressure Get a mammogram. Add exercise such as walking   And yoga.   Untreated   High blood pressure is a risk for  Stroke and heart attack and heart failure . Also reduce stress. Advise repeat CBC in about 3 months  And if stable can go yearly  Recheck if Bp readings are  Increased  For evaluation and management.   You may have a uti and we can treat and if continues then recheck.  DASH Diet The DASH diet stands for "Dietary Approaches to Stop Hypertension." It is a healthy eating plan that has been shown to reduce high blood pressure (hypertension) in as little as 14 days, while also possibly providing other significant health benefits. These other health benefits include reducing the risk of breast cancer after menopause and reducing the risk of type 2 diabetes, heart disease, colon cancer, and stroke. Health benefits also include weight loss and slowing kidney failure in patients with chronic kidney disease.  DIET GUIDELINES  Limit salt (sodium). Your diet should contain less than 1500 mg of sodium daily.  Limit refined or processed carbohydrates. Your diet should include mostly whole grains. Desserts and added sugars should be used sparingly.  Include small amounts of heart-healthy fats. These types of fats include nuts, oils, and tub margarine. Limit saturated and trans fats. These fats have been shown to be harmful in the body. CHOOSING FOODS  The following food groups are based on a 2000 calorie diet. See your Registered Dietitian for individual calorie  needs. Grains and Grain Products (6 to 8 servings daily)  Eat More Often: Whole-wheat bread, brown rice, whole-grain or wheat pasta, quinoa, popcorn without added fat or salt (air popped).  Eat Less Often: White bread, white pasta, white rice, cornbread. Vegetables (4 to 5 servings daily)  Eat More Often: Fresh, frozen, and canned vegetables. Vegetables may be raw, steamed, roasted, or grilled with a minimal amount of fat.  Eat Less Often/Avoid: Creamed or fried vegetables. Vegetables in a cheese sauce. Fruit (4 to 5 servings daily)  Eat More Often: All fresh, canned (in natural juice), or frozen fruits. Dried fruits without added sugar. One hundred percent fruit juice ( cup [237 mL] daily).  Eat Less Often: Dried fruits with added sugar. Canned fruit in light or heavy syrup. [employer], Fish, and Poultry (2 servings or less daily.  One serving is 3 to 4 oz [85-114 g]).  Eat More Often: Ninety percent or leaner ground beef, tenderloin, sirloin. Round cuts of beef, chicken breast, [place] breast. All fish. Grill, bake, or broil your meat. Nothing should be fried.  Eat Less Often/Avoid: Fatty cuts of meat, [place], or chicken leg, thigh, or wing. Fried cuts of meat or fish. Dairy (2 to 3 servings)  Eat More Often: Low-fat or fat-free milk, low-fat plain or light yogurt, reduced-fat or part-skim cheese.  Eat Less Often/Avoid: Milk (whole, 2%).Whole milk yogurt. Full-fat cheeses. Nuts, Seeds, and Legumes (4 to 5 servings per week)  Eat More Often: All without added salt.  Eat Less Often/Avoid: Salted nuts and seeds, canned beans with added salt. Fats and Sweets (limited)  Eat More Often: Vegetable oils, tub margarines without trans fats, sugar-free gelatin. Mayonnaise and salad dressings.  Eat Less Often/Avoid: Coconut oils, palm oils, butter, stick margarine, cream, half and half, cookies, candy, pie. FOR MORE INFORMATION The Dash Diet Eating Plan: www.dashdiet.org Document  Released: 11/18/2011 Document Revised: 02/21/2012 Document Reviewed: 11/18/2011 [facility] Patient Information 2013 [place], [place].   How to Take Your Blood Pressure  These instructions are only for electronic home blood pressure machines. You will need:   An automatic or semi-automatic blood pressure machine.  Fresh batteries for the blood pressure machine. HOW DO I USE THESE TOOLS TO CHECK MY BLOOD PRESSURE?   There are 2 numbers that make up your blood pressure. For example: 120/80.  The first number (120 in our example) is called the "systolic pressure." It is a measure of the pressure in your blood vessels when your heart is pumping blood.  The second number (80 in our example) is called the "diastolic pressure." It is a measure of the pressure in your blood vessels when your heart is resting between beats.  Before you buy a home blood pressure machine, check the size of your arm so you can buy the right size cuff. Here is how to check the size of your arm:  Use a tape measure that shows both inches and centimeters.  Wrap the tape measure around the middle upper part of your arm. You may need someone to help you measure right.  Write down your arm measurement in both inches and centimeters.  To measure your blood pressure right, it is important to have the right size cuff.  If your arm is up to 13 inches (37 to 34 centimeters), get an adult cuff size.  If your arm is 13 to 17 inches (35 to 44 centimeters), get a large adult cuff size.  If your arm is 17 to 20 inches (45 to 52 centimeters), get an adult thigh cuff.  Try to rest or relax for at least 30 minutes before you check your blood pressure.  Do not smoke.  Do not have any drinks with caffeine, such as:  Pop.  Coffee.  Tea.  Check your blood pressure in a quiet room.  Sit down and stretch out your arm on a table. Keep your arm at about the level of your heart. Let your arm relax. GETTING BLOOD PRESSURE  READINGS  Make sure you remove any tight-fighting clothing from your arm. Wrap the cuff around your upper arm. Wrap it just above the bend, and above where you felt the pulse. You should be able to slip a finger between the cuff and your arm. If you cannot slip a finger in the cuff, it is too tight and should  be removed and rewrapped.  Some units requires you to manually pump up the arm cuff.  Automatic units inflate the cuff when you press a button.  Cuff deflation is automatic in both models.  After the cuff is inflated, the unit measures your blood pressure and pulse. The readings are displayed on a monitor. Hold still and breathe normally while the cuff is inflated.  Getting a reading takes less than a minute.  Some models store readings in a memory. Some provide a printout of readings.  Get readings at different times of the day. You should wait at least 5 minutes between readings. Take readings with you to your next doctor's visit. Document Released: 11/11/2008 Document Revised: 02/21/2012 Document Reviewed: 11/11/2008 [facility] Patient Information 2013 [place], [place].   Preventive Care for Adults, Female A healthy lifestyle and preventive care can promote health and wellness. Preventive health guidelines for women include the following key practices.  A routine yearly physical is a good way to check with your caregiver about your health and preventive screening. It is a chance to share any concerns and updates on your health, and to receive a thorough exam.  Visit your dentist for a routine exam and preventive care every 6 months. Brush your teeth twice a day and floss once a day. Good oral hygiene prevents tooth decay and gum disease.  The frequency of eye exams is based on your age, health, family medical history, use of contact lenses, and other factors. Follow your caregiver's recommendations for frequency of eye exams.  Eat a healthy diet. Foods like vegetables, fruits,  whole grains, low-fat dairy products, and lean protein foods contain the nutrients you need without too many calories. Decrease your intake of foods high in solid fats, added sugars, and salt. Eat the right amount of calories for you.Get information about a proper diet from your caregiver, if necessary.  Regular physical exercise is one of the most important things you can do for your health. Most adults should get at least 150 minutes of moderate-intensity exercise (any activity that increases your heart rate and causes you to sweat) each week. In addition, most adults need muscle-strengthening exercises on 2 or more days a week.  Maintain a healthy weight. The body mass index (BMI) is a screening tool to identify possible weight problems. It provides an estimate of body fat based on height and weight. Your caregiver can help determine your BMI, and can help you achieve or maintain a healthy weight.For adults 20 years and older:  A BMI below 18.5 is considered underweight.  A BMI of 18.5 to 24.9 is normal.  A BMI of 25 to 29.9 is considered overweight.  A BMI of 30 and above is considered obese.  Maintain normal blood lipids and cholesterol levels by exercising and minimizing your intake of saturated fat. Eat a balanced diet with plenty of fruit and vegetables. Blood tests for lipids and cholesterol should begin at age 12 and be repeated every 5 years. If your lipid or cholesterol levels are high, you are over 50, or you are at high risk for heart disease, you may need your cholesterol levels checked more frequently.Ongoing high lipid and cholesterol levels should be treated with medicines if diet and exercise are not effective.  If you smoke, find out from your caregiver how to quit. If you do not use tobacco, do not start.  If you are pregnant, do not drink alcohol. If you are breastfeeding, be very cautious about drinking alcohol. If  you are not pregnant and choose to drink alcohol, do not  exceed 1 drink per day. One drink is considered to be 12 ounces (355 mL) of beer, 5 ounces (148 mL) of wine, or 1.5 ounces (44 mL) of liquor.  Avoid use of street drugs. Do not share needles with anyone. Ask for help if you need support or instructions about stopping the use of drugs.  High blood pressure causes heart disease and increases the risk of stroke. Your blood pressure should be checked at least every 1 to 2 years. Ongoing high blood pressure should be treated with medicines if weight loss and exercise are not effective.  If you are 71 to 52 years old, ask your caregiver if you should take aspirin to prevent strokes.  Diabetes screening involves taking a blood sample to check your fasting blood sugar level. This should be done once every 3 years, after age 72, if you are within normal weight and without risk factors for diabetes. Testing should be considered at a younger age or be carried out more frequently if you are overweight and have at least 1 risk factor for diabetes.  Breast cancer screening is essential preventive care for women. You should practice "breast self-awareness." This means understanding the normal appearance and feel of your breasts and may include breast self-examination. Any changes detected, no matter how small, should be reported to a caregiver. Women in their 15s and 30s should have a clinical breast exam (CBE) by a caregiver as part of a regular health exam every 1 to 3 years. After age 12, women should have a CBE every year. Starting at age 54, women should consider having a mammography (breast X-ray test) every year. Women who have a family history of breast cancer should talk to their caregiver about genetic screening. Women at a high risk of breast cancer should talk to their caregivers about having magnetic resonance imaging (MRI) and a mammography every year.  The Pap test is a screening test for cervical cancer. A Pap test can show cell changes on the cervix  that might become cervical cancer if left untreated. A Pap test is a procedure in which cells are obtained and examined from the lower end of the uterus (cervix).  Women should have a Pap test starting at age 82.  Between ages 20 and 64, Pap tests should be repeated every 2 years.  Beginning at age 4, you should have a Pap test every 3 years as long as the past 3 Pap tests have been normal.  Some women have medical problems that increase the chance of getting cervical cancer. Talk to your caregiver about these problems. It is especially important to talk to your caregiver if a new problem develops soon after your last Pap test. In these cases, your caregiver may recommend more frequent screening and Pap tests.  The above recommendations are the same for women who have or have not gotten the vaccine for human papillomavirus (HPV).  If you had a hysterectomy for a problem that was not cancer or a condition that could lead to cancer, then you no longer need Pap tests. Even if you no longer need a Pap test, a regular exam is a good idea to make sure no other problems are starting.  If you are between ages 51 and 70, and you have had normal Pap tests going back 10 years, you no longer need Pap tests. Even if you no longer need a Pap test, a regular  exam is a good idea to make sure no other problems are starting.  If you have had past treatment for cervical cancer or a condition that could lead to cancer, you need Pap tests and screening for cancer for at least 20 years after your treatment.  If Pap tests have been discontinued, risk factors (such as a new sexual partner) need to be reassessed to determine if screening should be resumed.  The HPV test is an additional test that may be used for cervical cancer screening. The HPV test looks for the virus that can cause the cell changes on the cervix. The cells collected during the Pap test can be tested for HPV. The HPV test could be used to screen  women aged 36 years and older, and should be used in women of any age who have unclear Pap test results. After the age of 77, women should have HPV testing at the same frequency as a Pap test.  Colorectal cancer can be detected and often prevented. Most routine colorectal cancer screening begins at the age of 30 and continues through age 60. However, your caregiver may recommend screening at an earlier age if you have risk factors for colon cancer. On a yearly basis, your caregiver may provide home test kits to check for hidden blood in the stool. Use of a small camera at the end of a tube, to directly examine the colon (sigmoidoscopy or colonoscopy), can detect the earliest forms of colorectal cancer. Talk to your caregiver about this at age 61, when routine screening begins. Direct examination of the colon should be repeated every 5 to 10 years through age 4, unless early forms of pre-cancerous polyps or small growths are found.  Hepatitis C blood testing is recommended for all people born from [birth year] through 1966 and any individual with known risks for hepatitis C.  Practice safe sex. Use condoms and avoid high-risk sexual practices to reduce the spread of sexually transmitted infections (STIs). STIs include gonorrhea, chlamydia, syphilis, trichomonas, herpes, HPV, and human immunodeficiency virus (HIV). Herpes, HIV, and HPV are viral illnesses that have no cure. They can result in disability, cancer, and death. Sexually active women aged 46 and younger should be checked for chlamydia. Older women with new or multiple partners should also be tested for chlamydia. Testing for other STIs is recommended if you are sexually active and at increased risk.  Osteoporosis is a disease in which the bones lose minerals and strength with aging. This can result in serious bone fractures. The risk of osteoporosis can be identified using a bone density scan. Women ages 14 and over and women at risk for fractures or  osteoporosis should discuss screening with their caregivers. Ask your caregiver whether you should take a calcium supplement or vitamin D to reduce the rate of osteoporosis.  Menopause can be associated with physical symptoms and risks. Hormone replacement therapy is available to decrease symptoms and risks. You should talk to your caregiver about whether hormone replacement therapy is right for you.  Use sunscreen with sun protection factor (SPF) of 30 or more. Apply sunscreen liberally and repeatedly throughout the day. You should seek shade when your shadow is shorter than you. Protect yourself by wearing long sleeves, pants, a wide-brimmed hat, and sunglasses year round, whenever you are outdoors.  Once a month, do a whole body skin exam, using a mirror to look at the skin on your back. Notify your caregiver of new moles, moles that have irregular borders, moles  that are larger than a pencil eraser, or moles that have changed in shape or color.  Stay current with required immunizations.  Influenza. You need a dose every fall (or winter). The composition of the flu vaccine changes each year, so being vaccinated once is not enough.  Pneumococcal polysaccharide. You need 1 to 2 doses if you smoke cigarettes or if you have certain chronic medical conditions. You need 1 dose at age 48 (or older) if you have never been vaccinated.  Tetanus, diphtheria, pertussis (Tdap, Td). Get 1 dose of Tdap vaccine if you are younger than age 54, are over 83 and have contact with an infant, are a Research scientist (physical sciences), are pregnant, or simply want to be protected from whooping cough. After that, you need a Td booster dose every 10 years. Consult your caregiver if you have not had at least 3 tetanus and diphtheria-containing shots sometime in your life or have a deep or dirty wound.  HPV. You need this vaccine if you are a woman age 16 or younger. The vaccine is given in 3 doses over 6 months.  Measles, mumps, rubella  (MMR). You need at least 1 dose of MMR if you were born in 1957 or later. You may also need a second dose.  Meningococcal. If you are age 76 to 58 and a first-year college student living in a residence hall, or have one of several medical conditions, you need to get vaccinated against meningococcal disease. You may also need additional booster doses.  Zoster (shingles). If you are age 74 or older, you should get this vaccine.  Varicella (chickenpox). If you have never had chickenpox or you were vaccinated but received only 1 dose, talk to your caregiver to find out if you need this vaccine.  Hepatitis A. You need this vaccine if you have a specific risk factor for hepatitis A virus infection or you simply wish to be protected from this disease. The vaccine is usually given as 2 doses, 6 to 18 months apart.  Hepatitis B. You need this vaccine if you have a specific risk factor for hepatitis B virus infection or you simply wish to be protected from this disease. The vaccine is given in 3 doses, usually over 6 months. Preventive Services / Frequency Ages 63 to 20  Blood pressure check.** / Every 1 to 2 years.  Lipid and cholesterol check.** / Every 5 years beginning at age 55.  Clinical breast exam.** / Every 3 years for women in their 79s and 30s.  Pap test.** / Every 2 years from ages 65 through 73. Every 3 years starting at age 33 through age 21 or 74 with a history of 3 consecutive normal Pap tests.  HPV screening.** / Every 3 years from ages 33 through ages 6 to 57 with a history of 3 consecutive normal Pap tests.  Hepatitis C blood test.** / For any individual with known risks for hepatitis C.  Skin self-exam. / Monthly.  Influenza immunization.** / Every year.  Pneumococcal polysaccharide immunization.** / 1 to 2 doses if you smoke cigarettes or if you have certain chronic medical conditions.  Tetanus, diphtheria, pertussis (Tdap, Td) immunization. / A one-time dose of Tdap  vaccine. After that, you need a Td booster dose every 10 years.  HPV immunization. / 3 doses over 6 months, if you are 46 and younger.  Measles, mumps, rubella (MMR) immunization. / You need at least 1 dose of MMR if you were born in 1957 or later.  You may also need a second dose.  Meningococcal immunization. / 1 dose if you are age 41 to 62 and a first-year college student living in a residence hall, or have one of several medical conditions, you need to get vaccinated against meningococcal disease. You may also need additional booster doses.  Varicella immunization.** / Consult your caregiver.  Hepatitis A immunization.** / Consult your caregiver. 2 doses, 6 to 18 months apart.  Hepatitis B immunization.** / Consult your caregiver. 3 doses usually over 6 months. Ages 8 to 30  Blood pressure check.** / Every 1 to 2 years.  Lipid and cholesterol check.** / Every 5 years beginning at age 80.  Clinical breast exam.** / Every year after age 59.  Mammogram.** / Every year beginning at age 8 and continuing for as long as you are in good health. Consult with your caregiver.  Pap test.** / Every 3 years starting at age 63 through age 72 or 69 with a history of 3 consecutive normal Pap tests.  HPV screening.** / Every 3 years from ages 64 through ages 40 to 38 with a history of 3 consecutive normal Pap tests.  Fecal occult blood test (FOBT) of stool. / Every year beginning at age 46 and continuing until age 20. You may not need to do this test if you get a colonoscopy every 10 years.  Flexible sigmoidoscopy or colonoscopy.** / Every 5 years for a flexible sigmoidoscopy or every 10 years for a colonoscopy beginning at age 6 and continuing until age 78.  Hepatitis C blood test.** / For all people born from [birth year] through 1966 and any individual with known risks for hepatitis C.  Skin self-exam. / Monthly.  Influenza immunization.** / Every year.  Pneumococcal polysaccharide  immunization.** / 1 to 2 doses if you smoke cigarettes or if you have certain chronic medical conditions.  Tetanus, diphtheria, pertussis (Tdap, Td) immunization.** / A one-time dose of Tdap vaccine. After that, you need a Td booster dose every 10 years.  Measles, mumps, rubella (MMR) immunization. / You need at least 1 dose of MMR if you were born in 1957 or later. You may also need a second dose.  Varicella immunization.** / Consult your caregiver.  Meningococcal immunization.** / Consult your caregiver.  Hepatitis A immunization.** / Consult your caregiver. 2 doses, 6 to 18 months apart.  Hepatitis B immunization.** / Consult your caregiver. 3 doses, usually over 6 months. Ages 63 and over  Blood pressure check.** / Every 1 to 2 years.  Lipid and cholesterol check.** / Every 5 years beginning at age 61.  Clinical breast exam.** / Every year after age 83.  Mammogram.** / Every year beginning at age 72 and continuing for as long as you are in good health. Consult with your caregiver.  Pap test.** / Every 3 years starting at age 47 through age 107 or 40 with a 3 consecutive normal Pap tests. Testing can be stopped between 65 and 70 with 3 consecutive normal Pap tests and no abnormal Pap or HPV tests in the past 10 years.  HPV screening.** / Every 3 years from ages 27 through ages 41 or 108 with a history of 3 consecutive normal Pap tests. Testing can be stopped between 65 and 70 with 3 consecutive normal Pap tests and no abnormal Pap or HPV tests in the past 10 years.  Fecal occult blood test (FOBT) of stool. / Every year beginning at age 47 and continuing until age 52. You may not  need to do this test if you get a colonoscopy every 10 years.  Flexible sigmoidoscopy or colonoscopy.** / Every 5 years for a flexible sigmoidoscopy or every 10 years for a colonoscopy beginning at age 53 and continuing until age 57.  Hepatitis C blood test.** / For all people born from [birth year] through 1966 and  any individual with known risks for hepatitis C.  Osteoporosis screening.** / A one-time screening for women ages 72 and over and women at risk for fractures or osteoporosis.  Skin self-exam. / Monthly.  Influenza immunization.** / Every year.  Pneumococcal polysaccharide immunization.** / 1 dose at age 57 (or older) if you have never been vaccinated.  Tetanus, diphtheria, pertussis (Tdap, Td) immunization. / A one-time dose of Tdap vaccine if you are over 65 and have contact with an infant, are a Research scientist (physical sciences), or simply want to be protected from whooping cough. After that, you need a Td booster dose every 10 years.  Varicella immunization.** / Consult your caregiver.  Meningococcal immunization.** / Consult your caregiver.  Hepatitis A immunization.** / Consult your caregiver. 2 doses, 6 to 18 months apart.  Hepatitis B immunization.** / Check with your caregiver. 3 doses, usually over 6 months. ** Family history and personal history of risk and conditions may change your caregiver's recommendations. Document Released: 01/25/2002 Document Revised: 02/21/2012 Document Reviewed: 04/26/2011 [facility] Patient Information 2013 [place], [place].      Neta Mends. Levi Crass M.D.

## 2012-11-02 DIAGNOSIS — Z90710 Acquired absence of both cervix and uterus: Secondary | ICD-10-CM | POA: Insufficient documentation

## 2013-05-07 IMAGING — CR DG ABDOMEN ACUTE W/ 1V CHEST
3 series · 3 of 3 positions shown · non-contrast
Comparison: None.

CLINICAL DATA: Abdominal pain and constipation

ACUTE ABDOMEN SERIES (ABDOMEN 2 VIEW & CHEST 1 VIEW)

[w chest pa]
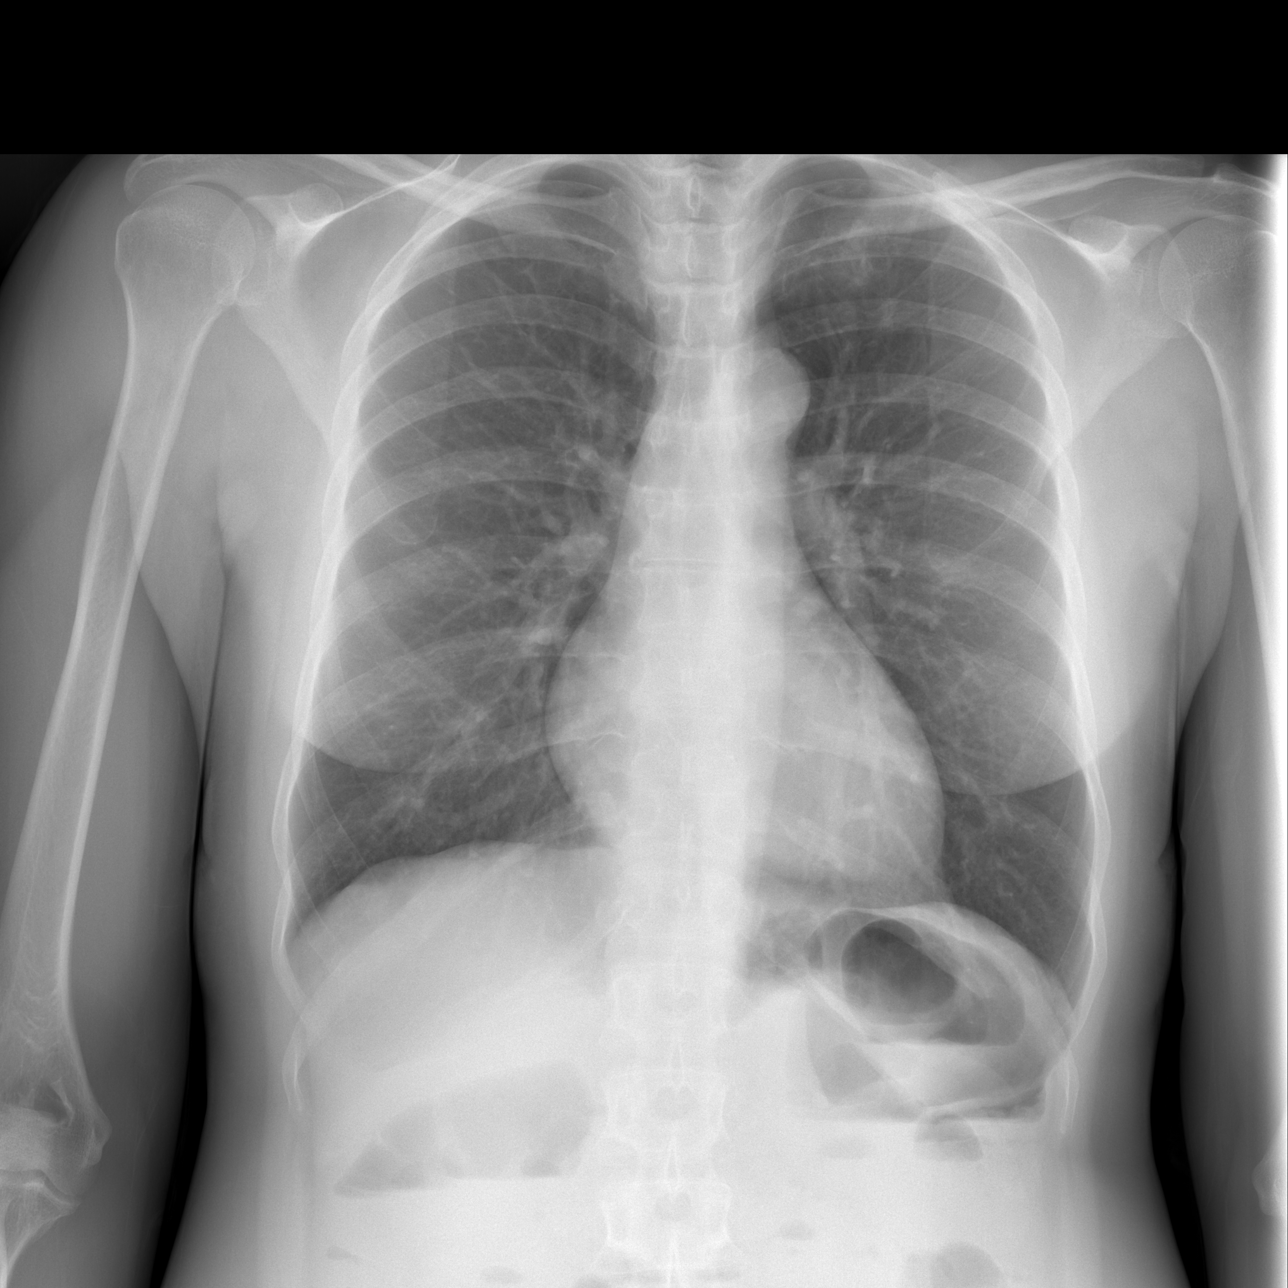

[w abdomen upright *]
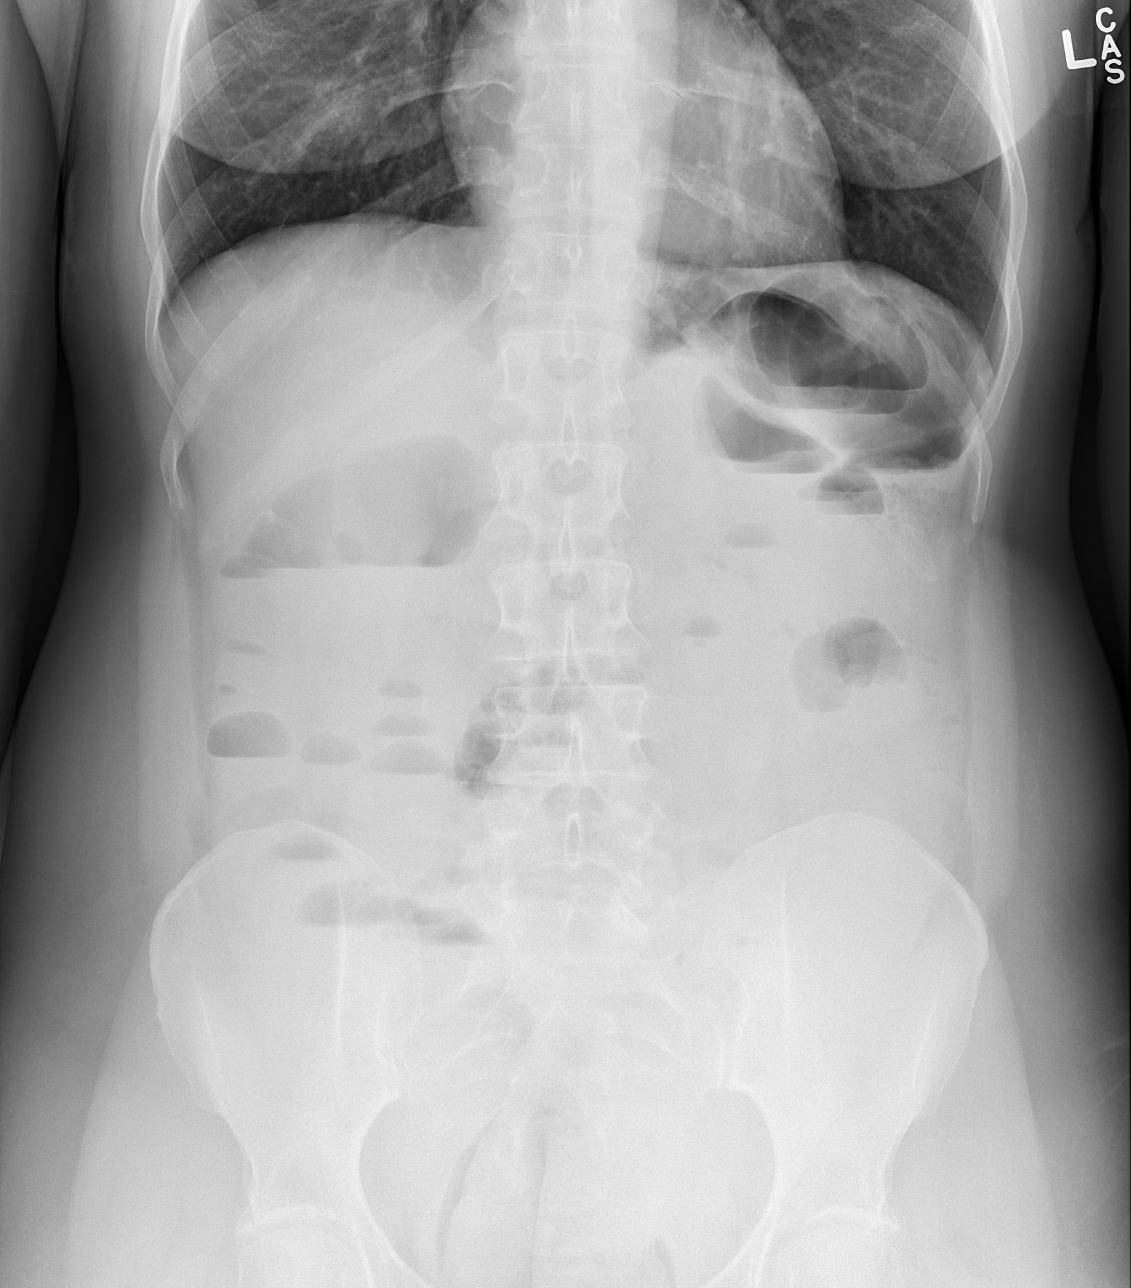

[t abdomen supine]
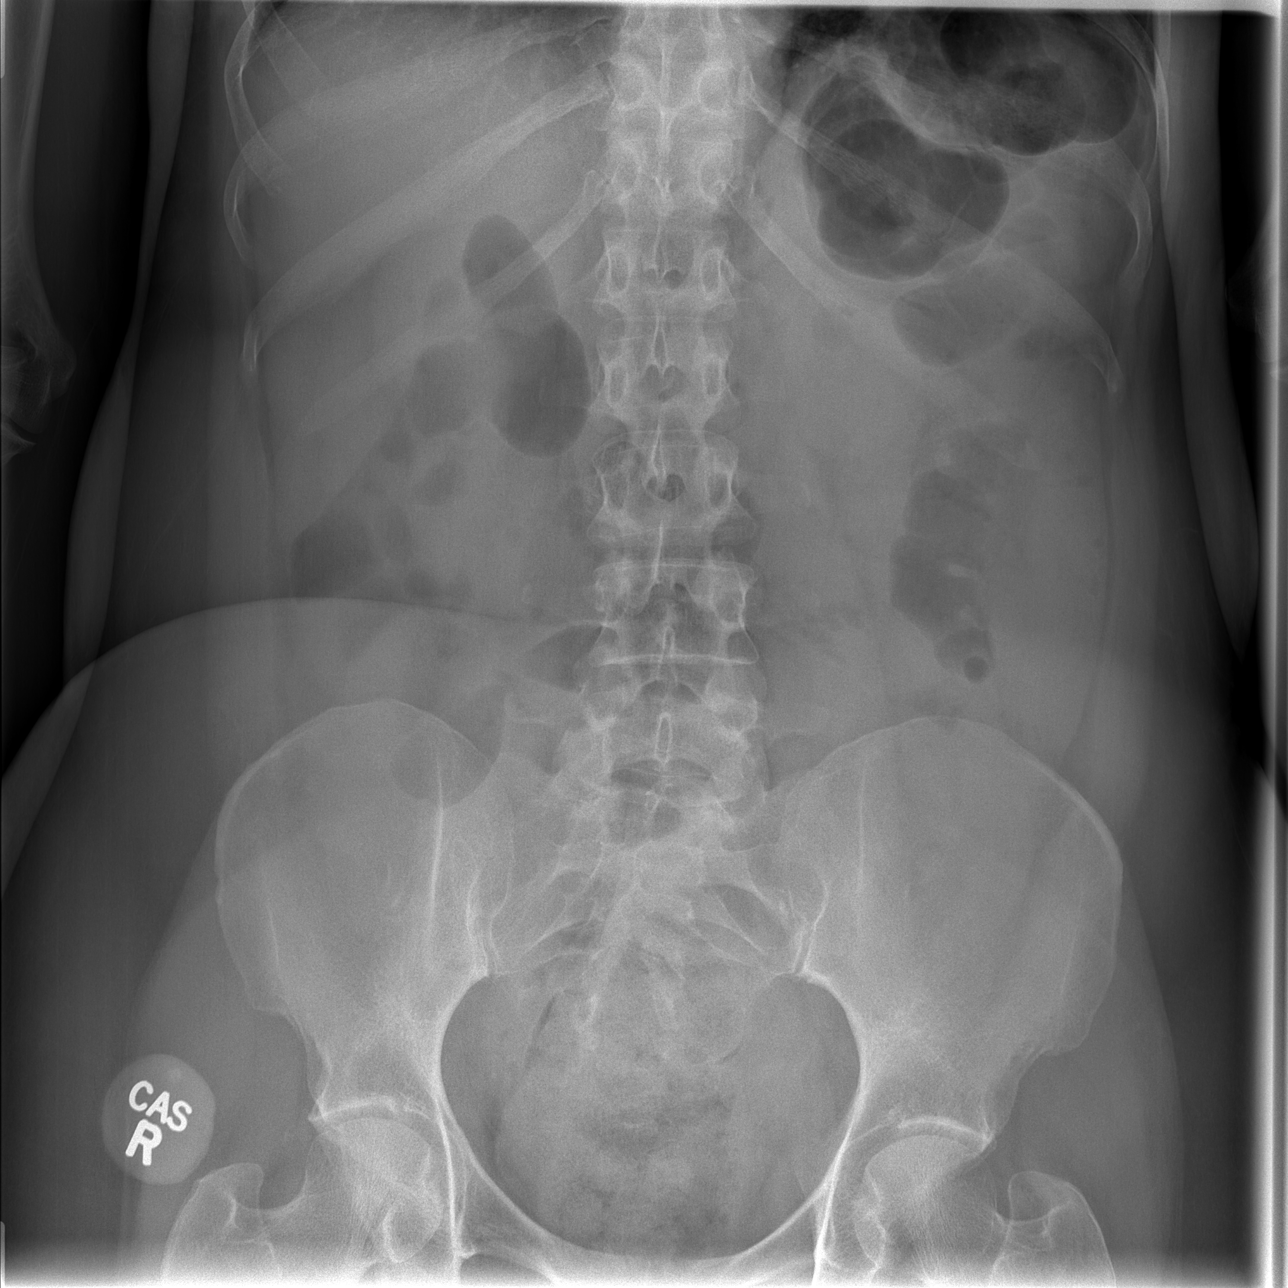

[3 of 3 positions shown; findings below may reference images not displayed]

FINDINGS: Cardiomediastinal silhouette is within normal limits. The
lungs are clear. No pleural effusion.  No pneumothorax.  No acute
osseous abnormality.  No free air beneath the diaphragms.

There are multiple differential air-fluid levels throughout the
abdomen.  No dilated gas filled loop of small bowel is seen.
Osseous structures are within normal limits.  No abnormal calcific
opacity.  There is gas noted within descending nondilated colon.
IMPRESSION: No acute cardiopulmonary process.  No free air.

Multiple differential air-fluid levels which could suggest ileus
with fluid-filled loops of small bowel versus mid to distal small
bowel obstruction.

## 2014-10-14 ENCOUNTER — Encounter: Payer: Self-pay | Admitting: Internal Medicine

## 2015-04-16 ENCOUNTER — Encounter: Payer: Self-pay | Admitting: Internal Medicine

## 2015-05-28 ENCOUNTER — Ambulatory Visit (INDEPENDENT_AMBULATORY_CARE_PROVIDER_SITE_OTHER): Payer: BLUE CROSS/BLUE SHIELD | Admitting: Internal Medicine

## 2015-05-28 ENCOUNTER — Encounter: Payer: Self-pay | Admitting: Internal Medicine

## 2015-05-28 VITALS — BP 144/90 | Temp 98.3°F | Ht 64.0 in | Wt 161.7 lb

## 2015-05-28 DIAGNOSIS — Z0000 Encounter for general adult medical examination without abnormal findings: Secondary | ICD-10-CM | POA: Diagnosis not present

## 2015-05-28 DIAGNOSIS — R350 Frequency of micturition: Secondary | ICD-10-CM | POA: Diagnosis not present

## 2015-05-28 DIAGNOSIS — I10 Essential (primary) hypertension: Secondary | ICD-10-CM | POA: Diagnosis not present

## 2015-05-28 LAB — POCT URINALYSIS DIPSTICK
Bilirubin, UA: NEGATIVE
Blood, UA: NEGATIVE
Glucose, UA: NEGATIVE
Ketones, UA: NEGATIVE
NITRITE UA: NEGATIVE
PH UA: 7
Protein, UA: NEGATIVE
Spec Grav, UA: 1.01
UROBILINOGEN UA: 0.2

## 2015-05-28 MED ORDER — LISINOPRIL 10 MG PO TABS: 10.0000 mg | ORAL_TABLET | Freq: Every day | ORAL | Status: DC

## 2015-05-28 NOTE — Progress Notes (Signed)
Pre visit review using our clinic review tool, if applicable. No additional management support is needed unless otherwise documented below in the visit note.  Chief Complaint  Patient presents with  . Annual Exam    HPI: Patient  Sue Golden  54 y.o. comes in today for elevated bp in last year and  for Preventive Health Care visit  Last visit 2013  No major change health  But ? Elevated bp   Concerning  About bp readings   Last   A month ago  Was 134/90   Sister has dementia   Stress  Trying natural thinks  Supplement  And magnesium.    doesn want to check   Avoiding medication but will take if deemed so. Advised  Home readings   Not checking  In a while  May need surgery on foot.  Hammer toe  Knows need to get bp good   Health Maintenance  Topic Date Due  . MAMMOGRAM  09/11/2010  . PAP SMEAR  11/30/2013  . HIV Screening  05/13/2016 (Originally 09/12/1976)  . INFLUENZA VACCINE  07/14/2015  . TETANUS/TDAP  04/19/2018  . COLONOSCOPY  05/08/2020   Health Maintenance Review Neg tad ROS:  Has stress GEN/ HEENT: No fever, significant weight changes sweats headaches vision problems hearing changes, CV/ PULM; No chest pain shortness of breath cough, syncope,edema  change in exercise tolerance. GI /GU: No adominal pain, vomiting, change in bowel habits. No blood in the stool. No significant GU symptoms. SKIN/HEME: ,no acute skin rashes suspicious lesions or bleeding. No lymphadenopathy, nodules, masses.  NEURO/ PSYCH:  No neurologic signs such as weakness numbness. No depression anxiety. IMM/ Allergy: No unusual infections.  Allergy .   REST of 12 system review negative except as per HPI   Past Medical History  Diagnosis Date  . Anemia   . Chicken pox     Past Surgical History  Procedure Laterality Date  . Partial hysterectomy      for bledding 1994  . Cesarean section      Family History  Problem Relation Age of Onset  . Hypertension Mother   . Diabetes Father      History   Social History  . Marital Status: Single    Spouse Name: N/A  . Number of Children: N/A  . Years of Education: N/A   Social History Main Topics  . Smoking status: Former Games developer  . Smokeless tobacco: Not on file  . Alcohol Use: No  . Drug Use: Not on file  . Sexual Activity: Not on file   Other Topics Concern  . None   Social History Narrative   hh of 2    Pet dog   Working [employer] 8 - 5    Cleaning  5 days a week.    No tobacco   caffiene in am    ETOh.   Sis dementia and shares help taking care of her in her 52 s     Outpatient Prescriptions Prior to Visit  Medication Sig Dispense Refill  . sulfamethoxazole-trimethoprim (SEPTRA DS) 800-160 MG per tablet Take 1 tablet by mouth 2 (two) times daily. 6 tablet 0   No facility-administered medications prior to visit.     EXAM:  BP 144/90 mmHg  Temp(Src) 98.3 F (36.8 C) (Oral)  Ht 5\' 4"  (1.626 m)  Wt 161 lb 11.2 oz (73.347 kg)  BMI 27.74 kg/m2  Body mass index is 27.74 kg/(m^2).  Physical Exam: Vital signs reviewed  ZOX:WRUE is a well-developed well-nourished alert cooperative    who appearsr stated age in no acute distress.  HEENT: normocephalic atraumatic , Eyes: PERRL EOM's full, conjunctiva clear, Nares: paten,t no deformity discharge or tenderness., Ears: no deformity EAC's clear TMs with normal landmarks. Mouth: clear OP, no lesions, edema.  Moist mucous membranes. Dentition in adequate repair. NECK: supple without masses, thyromegaly or bruits. CHEST/PULM:  Clear to auscultation and percussion breath sounds equal no wheeze , rales or rhonchi. No chest wall deformities or tenderness.Breast: normal by inspection . No dimpling, discharge, masses, tenderness or discharge .CV: PMI is nondisplaced, S1 S2 no gallops, murmurs, rubs. Peripheral pulses are full without delay.No JVD .  ABDOMEN: Bowel sounds normal nontender  No guard or rebound, no hepato splenomegal no CVA tenderness.  No  hernia. Extremtities:  No clubbing cyanosis or edema, no acute joint swelling or redness no focal atrophy NEURO:  Oriented x3, cranial nerves 3-12 appear to be intact, no obvious focal weakness,gait within normal limits no abnormal reflexes or asymmetrical SKIN: No acute rashes normal turgor, color, no bruising or petechiae. PSYCH: Oriented, good eye contact, no obvious depression anxiety, cognition and judgment appear normal. LN: no cervical axillary inguinal adenopathy  Lab Results  Component Value Date   WBC 3.3* 10/24/2012   HGB 12.6 10/24/2012   HCT 37.9 10/24/2012   PLT 208.0 10/24/2012   GLUCOSE 94 10/24/2012   CHOL 206* 10/24/2012   TRIG 49.0 10/24/2012   HDL 65.30 10/24/2012   LDLDIRECT 131.2 10/24/2012   LDLCALC 118* 02/03/2010   ALT 16 10/24/2012   AST 17 10/24/2012   NA 139 10/24/2012   K 5.0 10/24/2012   CL 106 10/24/2012   CREATININE 0.8 10/24/2012   BUN 11 10/24/2012   CO2 28 10/24/2012   TSH 1.40 10/24/2012   ekg nl ASSESSMENT AND PLAN:  Discussed the following assessment and plan:  Visit for preventive health examination - reviewed hcm mammo due - Plan: EKG 12-Lead, POCT urinalysis dipstick  Essential hypertension - lower on repeat  begin low dos emed lsi and fu - Plan: EKG 12-Lead  Urinary frequency - check ua  - Plan: POCT urinalysis dipstick Advise add med dis med acei  And plan fu and abs reviewd work place screen tc 207 [id number] ratio2,8 bp 134/90 bg 95 plan labs  At fu appt bmp etc  Patient Care Team: Madelin Headings, MD as PCP - General Patient Instructions   Advise Continue lifestyle intervention healthy eating and exercise . For bp control and medication    Heart healthy diet . DASH and mediterranean.   Add medication  Goal is below 140/90 DASH Eating Plan DASH stands for "Dietary Approaches to Stop Hypertension." The DASH eating plan is a healthy eating plan that has been shown to reduce high blood pressure (hypertension). Additional health  benefits may include reducing the risk of type 2 diabetes mellitus, heart disease, and stroke. The DASH eating plan may also help with weight loss. WHAT DO I NEED TO KNOW ABOUT THE DASH EATING PLAN? For the DASH eating plan, you will follow these general guidelines:  Choose foods with a percent daily value for sodium of less than 5% (as listed on the food label).  Use salt-free seasonings or herbs instead of table salt or sea salt.  Check with your health care provider or pharmacist before using salt substitutes.  Eat lower-sodium products, often labeled as "lower sodium" or "no salt added."  Eat fresh foods.  Eat more  vegetables, fruits, and low-fat dairy products.  Choose whole grains. Look for the word "whole" as the first word in the ingredient list.  Choose fish and skinless chicken or [place] more often than red meat. Limit fish, poultry, and meat to 6 oz (170 g) each day.  Limit sweets, desserts, sugars, and sugary drinks.  Choose heart-healthy fats.  Limit cheese to 1 oz (28 g) per day.  Eat more home-cooked food and less restaurant, buffet, and fast food.  Limit fried foods.  Cook foods using methods other than frying.  Limit canned vegetables. If you do use them, rinse them well to decrease the sodium.  When eating at a restaurant, ask that your food be prepared with less salt, or no salt if possible. WHAT FOODS CAN I EAT? Seek help from a dietitian for individual calorie needs. Grains Whole grain or whole wheat bread. Brown rice. Whole grain or whole wheat pasta. Quinoa, bulgur, and whole grain cereals. Low-sodium cereals. Corn or whole wheat flour tortillas. Whole grain cornbread. Whole grain crackers. Low-sodium crackers. Vegetables Fresh or frozen vegetables (raw, steamed, roasted, or grilled). Low-sodium or reduced-sodium tomato and vegetable juices. Low-sodium or reduced-sodium tomato sauce and paste. Low-sodium or reduced-sodium canned vegetables.  Fruits All  fresh, canned (in natural juice), or frozen fruits. Meat and Other Protein Products Ground beef (85% or leaner), grass-fed beef, or beef trimmed of fat. Skinless chicken or [place]. Ground chicken or [place]. Pork trimmed of fat. All fish and seafood. Eggs. Dried beans, peas, or lentils. Unsalted nuts and seeds. Unsalted canned beans. Dairy Low-fat dairy products, such as skim or 1% milk, 2% or reduced-fat cheeses, low-fat ricotta or cottage cheese, or plain low-fat yogurt. Low-sodium or reduced-sodium cheeses. Fats and Oils Tub margarines without trans fats. Light or reduced-fat mayonnaise and salad dressings (reduced sodium). Avocado. Safflower, olive, or canola oils. Natural peanut or almond butter. Other Unsalted popcorn and pretzels. The items listed above may not be a complete list of recommended foods or beverages. Contact your dietitian for more options. WHAT FOODS ARE NOT RECOMMENDED? Grains White bread. White pasta. White rice. Refined cornbread. Bagels and croissants. Crackers that contain trans fat. Vegetables Creamed or fried vegetables. Vegetables in a cheese sauce. Regular canned vegetables. Regular canned tomato sauce and paste. Regular tomato and vegetable juices. Fruits Dried fruits. Canned fruit in light or heavy syrup. Fruit juice. Meat and Other Protein Products Fatty cuts of meat. Ribs, chicken wings, bacon, sausage, bologna, salami, chitterlings, fatback, hot dogs, bratwurst, and packaged luncheon meats. Salted nuts and seeds. Canned beans with salt. Dairy Whole or 2% milk, cream, half-and-half, and cream cheese. Whole-fat or sweetened yogurt. Full-fat cheeses or blue cheese. Nondairy creamers and whipped toppings. Processed cheese, cheese spreads, or cheese curds. Condiments Onion and garlic salt, seasoned salt, table salt, and sea salt. Canned and packaged gravies. Worcestershire sauce. Tartar sauce. Barbecue sauce. Teriyaki sauce. Soy sauce, including reduced sodium.  Steak sauce. Fish sauce. Oyster sauce. Cocktail sauce. Horseradish. Ketchup and mustard. Meat flavorings and tenderizers. Bouillon cubes. Hot sauce. Tabasco sauce. Marinades. Taco seasonings. Relishes. Fats and Oils Butter, stick margarine, lard, shortening, ghee, and bacon fat. Coconut, palm kernel, or palm oils. Regular salad dressings. Other Pickles and olives. Salted popcorn and pretzels. The items listed above may not be a complete list of foods and beverages to avoid. Contact your dietitian for more information. WHERE CAN I FIND MORE INFORMATION? National Heart, Lung, and Blood Institute: [employer] Document Released: 11/18/2011 Document Revised: 04/15/2014 Document Reviewed: 10/03/2013  [employer] Patient Information 2015 [place], [place]. This information is not intended to replace advice given to you by your health care provider. Make sure you discuss any questions you have with your health care provider.     Why follow it? Research shows. . Those who follow the Mediterranean diet have a reduced risk of heart disease  . The diet is associated with a reduced incidence of Parkinson's and Alzheimer's diseases . People following the diet may have longer life expectancies and lower rates of chronic diseases  . The Dietary Guidelines for Americans recommends the Mediterranean diet as an eating plan to promote health and prevent disease  What Is the Mediterranean Diet?  . Healthy eating plan based on typical foods and recipes of Mediterranean-style cooking . The diet is primarily a plant based diet; these foods should make up a majority of meals   Starches - Plant based foods should make up a majority of meals - They are an important sources of vitamins, minerals, energy, antioxidants, and fiber - Choose whole grains, foods high in fiber and minimally processed items  - Typical grain sources include wheat, oats, barley, corn, brown rice, bulgar, farro,  millet, polenta, couscous  - Various types of beans include chickpeas, lentils, fava beans, black beans, white beans   Fruits  Veggies - Large quantities of antioxidant rich fruits & veggies; 6 or more servings  - Vegetables can be eaten raw or lightly drizzled with oil and cooked  - Vegetables common to the traditional Mediterranean Diet include: artichokes, arugula, beets, broccoli, brussel sprouts, cabbage, carrots, celery, collard greens, cucumbers, eggplant, kale, leeks, lemons, lettuce, mushrooms, okra, onions, peas, peppers, potatoes, pumpkin, radishes, rutabaga, shallots, spinach, sweet potatoes, turnips, zucchini - Fruits common to the Mediterranean Diet include: apples, apricots, avocados, cherries, clementines, dates, figs, grapefruits, grapes, melons, nectarines, oranges, peaches, pears, pomegranates, strawberries, tangerines  Fats - Replace butter and margarine with healthy oils, such as olive oil, canola oil, and tahini  - Limit nuts to no more than a handful a day  - Nuts include walnuts, almonds, pecans, pistachios, pine nuts  - Limit or avoid candied, honey roasted or heavily salted nuts - Olives are central to the [employer] - can be eaten whole or used in a variety of dishes   Meats Protein - Limiting red meat: no more than a few times a month - When eating red meat: choose lean cuts and keep the portion to the size of deck of cards - Eggs: approx. 0 to 4 times a week  - Fish and lean poultry: at least 2 a week  - Healthy protein sources include, chicken, [place], lean beef, lamb - Increase intake of seafood such as tuna, salmon, trout, mackerel, shrimp, scallops - Avoid or limit high fat processed meats such as sausage and bacon  Dairy - Include moderate amounts of low fat dairy products  - Focus on healthy dairy such as fat free yogurt, skim milk, low or reduced fat cheese - Limit dairy products higher in fat such as whole or 2% milk, cheese, ice cream  Alcohol -  Moderate amounts of red wine is ok  - No more than 5 oz daily for women (all ages) and men older than age 74  - No more than 10 oz of wine daily for men younger than 33  Other - Limit sweets and other desserts  - Use herbs and spices instead of salt to flavor foods  - Herbs and spices common to the  traditional Mediterranean Diet include: basil, bay leaves, chives, cloves, cumin, fennel, garlic, lavender, marjoram, mint, oregano, parsley, pepper, rosemary, sage, savory, sumac, tarragon, thyme   It's not just a diet, it's a lifestyle:  . The Mediterranean diet includes lifestyle factors typical of those in the region  . Foods, drinks and meals are best eaten with others and savored . Daily physical activity is important for overall good health . This could be strenuous exercise like running and aerobics . This could also be more leisurely activities such as walking, housework, yard-work, or taking the stairs . Moderation is the key; a balanced and healthy diet accommodates most foods and drinks . Consider portion sizes and frequency of consumption of certain foods   Meal Ideas & Options:  . Breakfast:  o Whole wheat toast or whole wheat English muffins with peanut butter & hard boiled egg o Steel cut oats topped with apples & cinnamon and skim milk  o Fresh fruit: banana, strawberries, melon, berries, peaches  o Smoothies: strawberries, bananas, greek yogurt, peanut butter o Low fat greek yogurt with blueberries and granola  o Egg white omelet with spinach and mushrooms o Breakfast couscous: whole wheat couscous, apricots, skim milk, cranberries  . Sandwiches:  o Hummus and grilled vegetables (peppers, zucchini, squash) on whole wheat bread   o Grilled chicken on whole wheat pita with lettuce, tomatoes, cucumbers or tzatziki  o Tuna salad on whole wheat bread: tuna salad made with greek yogurt, olives, red peppers, capers, green onions o Garlic rosemary lamb pita: lamb sauted with garlic,  rosemary, salt & pepper; add lettuce, cucumber, greek yogurt to pita - flavor with lemon juice and black pepper  . Seafood:  o Mediterranean grilled salmon, seasoned with garlic, basil, parsley, lemon juice and black pepper o Shrimp, lemon, and spinach whole-grain pasta salad made with low fat greek yogurt  o Seared scallops with lemon orzo  o Seared tuna steaks seasoned salt, pepper, coriander topped with tomato mixture of olives, tomatoes, olive oil, minced garlic, parsley, green onions and cappers  . Meats:  o Herbed greek chicken salad with kalamata olives, cucumber, feta  o Red bell peppers stuffed with spinach, bulgur, lean ground beef (or lentils) & topped with feta   o Kebabs: skewers of chicken, tomatoes, onions, zucchini, squash  o [place] burgers: made with red onions, mint, dill, lemon juice, feta cheese topped with roasted red peppers . Vegetarian o Cucumber salad: cucumbers, artichoke hearts, celery, red onion, feta cheese, tossed in olive oil & lemon juice  o Hummus and whole grain pita points with a greek salad (lettuce, tomato, feta, olives, cucumbers, red onion) o Lentil soup with celery, carrots made with vegetable broth, garlic, salt and pepper  o Tabouli salad: parsley, bulgur, mint, scallions, cucumbers, tomato, radishes, lemon juice, olive oil, salt and pepper.      Stress Stress-related medical problems are becoming increasingly common. The body has a built-in physical response to stressful situations. Faced with pressure, challenge or danger, we need to react quickly. Our bodies release hormones such as cortisol and adrenaline to help do this. These hormones are part of the "fight or flight" response and affect the metabolic rate, heart rate and blood pressure, resulting in a heightened, stressed state that prepares the body for optimum performance in dealing with a stressful situation. It is likely that early man required these mechanisms to stay alive, but usually modern  stresses do not call for this, and the same hormones released in today's  world can damage health and reduce coping ability. CAUSES  Pressure to perform at work, at school or in sports.  Threats of physical violence.  Money worries.  Arguments.  Family conflicts.  Divorce or separation from significant other.  Bereavement.  New job or unemployment.  Changes in location.  Alcohol or drug abuse. SOMETIMES, THERE IS NO PARTICULAR REASON FOR DEVELOPING STRESS. Almost all people are at risk of being stressed at some time in their lives. It is important to know that some stress is temporary and some is long term.  Temporary stress will go away when a situation is resolved. Most people can cope with short periods of stress, and it can often be relieved by relaxing, taking a walk or getting any type of exercise, chatting through issues with friends, or having a good night's sleep.  Chronic (long-term, continuous) stress is much harder to deal with. It can be psychologically and emotionally damaging. It can be harmful both for an individual and for friends and family. SYMPTOMS Everyone reacts to stress differently. There are some common effects that help [place] recognize it. In times of extreme stress, people may:  Shake uncontrollably.  Breathe faster and deeper than normal (hyperventilate).  Vomit.  For people with asthma, stress can trigger an attack.  For some people, stress may trigger migraine headaches, ulcers, and body pain. PHYSICAL EFFECTS OF STRESS MAY INCLUDE:  Loss of energy.  Skin problems.  Aches and pains resulting from tense muscles, including neck ache, backache and tension headaches.  Increased pain from arthritis and other conditions.  Irregular heart beat (palpitations).  Periods of irritability or anger.  Apathy or depression.  Anxiety (feeling uptight or worrying).  Unusual behavior.  Loss of appetite.  Comfort eating.  Lack of  concentration.  Loss of, or decreased, sex-drive.  Increased smoking, drinking, or recreational drug use.  For women, missed periods.  Ulcers, joint pain, and muscle pain. Post-traumatic stress is the stress caused by any serious accident, strong emotional damage, or extremely difficult or violent experience such as rape or war. Post-traumatic stress victims can experience mixtures of emotions such as fear, shame, depression, guilt or anger. It may include recurrent memories or images that may be haunting. These feelings can last for weeks, months or even years after the traumatic event that triggered them. Specialized treatment, possibly with medicines and psychological therapies, is available. If stress is causing physical symptoms, severe distress or making it difficult for you to function as normal, it is worth seeing your caregiver. It is important to remember that although stress is a usual part of life, extreme or prolonged stress can lead to other illnesses that will need treatment. It is better to visit a doctor sooner rather than later. Stress has been linked to the development of high blood pressure and heart disease, as well as insomnia and depression. There is no diagnostic test for stress since everyone reacts to it differently. But a caregiver will be able to spot the physical symptoms, such as:  Headaches.  Shingles.  Ulcers. Emotional distress such as intense worry, low mood or irritability should be detected when the doctor asks pertinent questions to identify any underlying problems that might be the cause. In case there are physical reasons for the symptoms, the doctor may also want to do some tests to exclude certain conditions. If you feel that you are suffering from stress, try to identify the aspects of your life that are causing it. Sometimes you may not be  able to change or avoid them, but even a small change can have a positive ripple effect. A simple lifestyle change  can make all the difference. STRATEGIES THAT CAN HELP DEAL WITH STRESS:  Delegating or sharing responsibilities.  Avoiding confrontations.  Learning to be more assertive.  Regular exercise.  Avoid using alcohol or street drugs to cope.  Eating a healthy, balanced diet, rich in fruit and vegetables and proteins.  Finding humor or absurdity in stressful situations.  Never taking on more than you know you can handle comfortably.  Organizing your time better to get as much done as possible.  Talking to friends or family and sharing your thoughts and fears.  Listening to music or relaxation tapes.  Relaxation techniques like deep breathing, meditation, and yoga.  Tensing and then relaxing your muscles, starting at the toes and working up to the head and neck. If you think that you would benefit from help, either in identifying the things that are causing your stress or in learning techniques to help you relax, see a caregiver who is capable of helping you with this. Rather than relying on medications, it is usually better to try and identify the things in your life that are causing stress and try to deal with them. There are many techniques of managing stress including counseling, psychotherapy, aromatherapy, yoga, and exercise. Your caregiver can help you determine what is best for you. Document Released: 02/19/2003 Document Revised: 12/04/2013 Document Reviewed: 01/16/2008 [facility] Patient Information 2015 [place], [place]. This information is not intended to replace advice given to you by your health care provider. Make sure you discuss any questions you have with your health care provider.     Neta Mends. Panosh M.D.

## 2015-05-28 NOTE — Patient Instructions (Addendum)
Advise Continue lifestyle intervention healthy eating and exercise . For bp control and medication    Heart healthy diet . DASH and mediterranean.   Add medication  Goal is below 140/90 DASH Eating Plan DASH stands for "Dietary Approaches to Stop Hypertension." The DASH eating plan is a healthy eating plan that has been shown to reduce high blood pressure (hypertension). Additional health benefits may include reducing the risk of type 2 diabetes mellitus, heart disease, and stroke. The DASH eating plan may also help with weight loss. WHAT DO I NEED TO KNOW ABOUT THE DASH EATING PLAN? For the DASH eating plan, you will follow these general guidelines:  Choose foods with a percent daily value for sodium of less than 5% (as listed on the food label).  Use salt-free seasonings or herbs instead of table salt or sea salt.  Check with your health care provider or pharmacist before using salt substitutes.  Eat lower-sodium products, often labeled as "lower sodium" or "no salt added."  Eat fresh foods.  Eat more vegetables, fruits, and low-fat dairy products.  Choose whole grains. Look for the word "whole" as the first word in the ingredient list.  Choose fish and skinless chicken or [place] more often than red meat. Limit fish, poultry, and meat to 6 oz (170 g) each day.  Limit sweets, desserts, sugars, and sugary drinks.  Choose heart-healthy fats.  Limit cheese to 1 oz (28 g) per day.  Eat more home-cooked food and less restaurant, buffet, and fast food.  Limit fried foods.  Cook foods using methods other than frying.  Limit canned vegetables. If you do use them, rinse them well to decrease the sodium.  When eating at a restaurant, ask that your food be prepared with less salt, or no salt if possible. WHAT FOODS CAN I EAT? Seek help from a dietitian for individual calorie needs. Grains Whole grain or whole wheat bread. Brown rice. Whole grain or whole wheat pasta. Quinoa,  bulgur, and whole grain cereals. Low-sodium cereals. Corn or whole wheat flour tortillas. Whole grain cornbread. Whole grain crackers. Low-sodium crackers. Vegetables Fresh or frozen vegetables (raw, steamed, roasted, or grilled). Low-sodium or reduced-sodium tomato and vegetable juices. Low-sodium or reduced-sodium tomato sauce and paste. Low-sodium or reduced-sodium canned vegetables.  Fruits All fresh, canned (in natural juice), or frozen fruits. Meat and Other Protein Products Ground beef (85% or leaner), grass-fed beef, or beef trimmed of fat. Skinless chicken or [place]. Ground chicken or [place]. Pork trimmed of fat. All fish and seafood. Eggs. Dried beans, peas, or lentils. Unsalted nuts and seeds. Unsalted canned beans. Dairy Low-fat dairy products, such as skim or 1% milk, 2% or reduced-fat cheeses, low-fat ricotta or cottage cheese, or plain low-fat yogurt. Low-sodium or reduced-sodium cheeses. Fats and Oils Tub margarines without trans fats. Light or reduced-fat mayonnaise and salad dressings (reduced sodium). Avocado. Safflower, olive, or canola oils. Natural peanut or almond butter. Other Unsalted popcorn and pretzels. The items listed above may not be a complete list of recommended foods or beverages. Contact your dietitian for more options. WHAT FOODS ARE NOT RECOMMENDED? Grains White bread. White pasta. White rice. Refined cornbread. Bagels and croissants. Crackers that contain trans fat. Vegetables Creamed or fried vegetables. Vegetables in a cheese sauce. Regular canned vegetables. Regular canned tomato sauce and paste. Regular tomato and vegetable juices. Fruits Dried fruits. Canned fruit in light or heavy syrup. Fruit juice. Meat and Other Protein Products Fatty cuts of meat. Ribs, chicken wings, bacon, sausage, bologna,  salami, chitterlings, fatback, hot dogs, bratwurst, and packaged luncheon meats. Salted nuts and seeds. Canned beans with salt. Dairy Whole or 2% milk,  cream, half-and-half, and cream cheese. Whole-fat or sweetened yogurt. Full-fat cheeses or blue cheese. Nondairy creamers and whipped toppings. Processed cheese, cheese spreads, or cheese curds. Condiments Onion and garlic salt, seasoned salt, table salt, and sea salt. Canned and packaged gravies. Worcestershire sauce. Tartar sauce. Barbecue sauce. Teriyaki sauce. Soy sauce, including reduced sodium. Steak sauce. Fish sauce. Oyster sauce. Cocktail sauce. Horseradish. Ketchup and mustard. Meat flavorings and tenderizers. Bouillon cubes. Hot sauce. Tabasco sauce. Marinades. Taco seasonings. Relishes. Fats and Oils Butter, stick margarine, lard, shortening, ghee, and bacon fat. Coconut, palm kernel, or palm oils. Regular salad dressings. Other Pickles and olives. Salted popcorn and pretzels. The items listed above may not be a complete list of foods and beverages to avoid. Contact your dietitian for more information. WHERE CAN I FIND MORE INFORMATION? National Heart, Lung, and Blood Institute: [employer] Document Released: 11/18/2011 Document Revised: 04/15/2014 Document Reviewed: 10/03/2013 [facility] Patient Information 2015 [place], [place]. This information is not intended to replace advice given to you by your health care provider. Make sure you discuss any questions you have with your health care provider.     Why follow it? Research shows. . Those who follow the Mediterranean diet have a reduced risk of heart disease  . The diet is associated with a reduced incidence of Parkinson's and Alzheimer's diseases . People following the diet may have longer life expectancies and lower rates of chronic diseases  . The Dietary Guidelines for Americans recommends the Mediterranean diet as an eating plan to promote health and prevent disease  What Is the Mediterranean Diet?  . Healthy eating plan based on typical foods and recipes of Mediterranean-style  cooking . The diet is primarily a plant based diet; these foods should make up a majority of meals   Starches - Plant based foods should make up a majority of meals - They are an important sources of vitamins, minerals, energy, antioxidants, and fiber - Choose whole grains, foods high in fiber and minimally processed items  - Typical grain sources include wheat, oats, barley, corn, brown rice, bulgar, farro, millet, polenta, couscous  - Various types of beans include chickpeas, lentils, fava beans, black beans, white beans   Fruits  Veggies - Large quantities of antioxidant rich fruits & veggies; 6 or more servings  - Vegetables can be eaten raw or lightly drizzled with oil and cooked  - Vegetables common to the traditional Mediterranean Diet include: artichokes, arugula, beets, broccoli, brussel sprouts, cabbage, carrots, celery, collard greens, cucumbers, eggplant, kale, leeks, lemons, lettuce, mushrooms, okra, onions, peas, peppers, potatoes, pumpkin, radishes, rutabaga, shallots, spinach, sweet potatoes, turnips, zucchini - Fruits common to the Mediterranean Diet include: apples, apricots, avocados, cherries, clementines, dates, figs, grapefruits, grapes, melons, nectarines, oranges, peaches, pears, pomegranates, strawberries, tangerines  Fats - Replace butter and margarine with healthy oils, such as olive oil, canola oil, and tahini  - Limit nuts to no more than a handful a day  - Nuts include walnuts, almonds, pecans, pistachios, pine nuts  - Limit or avoid candied, honey roasted or heavily salted nuts - Olives are central to the [employer] - can be eaten whole or used in a variety of dishes   Meats Protein - Limiting red meat: no more than a few times a month - When eating red meat: choose lean cuts and keep the portion to the size  of deck of cards - Eggs: approx. 0 to 4 times a week  - Fish and lean poultry: at least 2 a week  - Healthy protein sources include, chicken, [place],  lean beef, lamb - Increase intake of seafood such as tuna, salmon, trout, mackerel, shrimp, scallops - Avoid or limit high fat processed meats such as sausage and bacon  Dairy - Include moderate amounts of low fat dairy products  - Focus on healthy dairy such as fat free yogurt, skim milk, low or reduced fat cheese - Limit dairy products higher in fat such as whole or 2% milk, cheese, ice cream  Alcohol - Moderate amounts of red wine is ok  - No more than 5 oz daily for women (all ages) and men older than age 25  - No more than 10 oz of wine daily for men younger than 60  Other - Limit sweets and other desserts  - Use herbs and spices instead of salt to flavor foods  - Herbs and spices common to the traditional Mediterranean Diet include: basil, bay leaves, chives, cloves, cumin, fennel, garlic, lavender, marjoram, mint, oregano, parsley, pepper, rosemary, sage, savory, sumac, tarragon, thyme   It's not just a diet, it's a lifestyle:  . The Mediterranean diet includes lifestyle factors typical of those in the region  . Foods, drinks and meals are best eaten with others and savored . Daily physical activity is important for overall good health . This could be strenuous exercise like running and aerobics . This could also be more leisurely activities such as walking, housework, yard-work, or taking the stairs . Moderation is the key; a balanced and healthy diet accommodates most foods and drinks . Consider portion sizes and frequency of consumption of certain foods   Meal Ideas & Options:  . Breakfast:  o Whole wheat toast or whole wheat English muffins with peanut butter & hard boiled egg o Steel cut oats topped with apples & cinnamon and skim milk  o Fresh fruit: banana, strawberries, melon, berries, peaches  o Smoothies: strawberries, bananas, greek yogurt, peanut butter o Low fat greek yogurt with blueberries and granola  o Egg white omelet with spinach and mushrooms o Breakfast  couscous: whole wheat couscous, apricots, skim milk, cranberries  . Sandwiches:  o Hummus and grilled vegetables (peppers, zucchini, squash) on whole wheat bread   o Grilled chicken on whole wheat pita with lettuce, tomatoes, cucumbers or tzatziki  o Tuna salad on whole wheat bread: tuna salad made with greek yogurt, olives, red peppers, capers, green onions o Garlic rosemary lamb pita: lamb sauted with garlic, rosemary, salt & pepper; add lettuce, cucumber, greek yogurt to pita - flavor with lemon juice and black pepper  . Seafood:  o Mediterranean grilled salmon, seasoned with garlic, basil, parsley, lemon juice and black pepper o Shrimp, lemon, and spinach whole-grain pasta salad made with low fat greek yogurt  o Seared scallops with lemon orzo  o Seared tuna steaks seasoned salt, pepper, coriander topped with tomato mixture of olives, tomatoes, olive oil, minced garlic, parsley, green onions and cappers  . Meats:  o Herbed greek chicken salad with kalamata olives, cucumber, feta  o Red bell peppers stuffed with spinach, bulgur, lean ground beef (or lentils) & topped with feta   o Kebabs: skewers of chicken, tomatoes, onions, zucchini, squash  o [place] burgers: made with red onions, mint, dill, lemon juice, feta cheese topped with roasted red peppers . Vegetarian o Cucumber salad: cucumbers, artichoke hearts,  celery, red onion, feta cheese, tossed in olive oil & lemon juice  o Hummus and whole grain pita points with a greek salad (lettuce, tomato, feta, olives, cucumbers, red onion) o Lentil soup with celery, carrots made with vegetable broth, garlic, salt and pepper  o Tabouli salad: parsley, bulgur, mint, scallions, cucumbers, tomato, radishes, lemon juice, olive oil, salt and pepper.      Stress Stress-related medical problems are becoming increasingly common. The body has a built-in physical response to stressful situations. Faced with pressure, challenge or danger, we need to react  quickly. Our bodies release hormones such as cortisol and adrenaline to help do this. These hormones are part of the "fight or flight" response and affect the metabolic rate, heart rate and blood pressure, resulting in a heightened, stressed state that prepares the body for optimum performance in dealing with a stressful situation. It is likely that early man required these mechanisms to stay alive, but usually modern stresses do not call for this, and the same hormones released in today's world can damage health and reduce coping ability. CAUSES  Pressure to perform at work, at school or in sports.  Threats of physical violence.  Money worries.  Arguments.  Family conflicts.  Divorce or separation from significant other.  Bereavement.  New job or unemployment.  Changes in location.  Alcohol or drug abuse. SOMETIMES, THERE IS NO PARTICULAR REASON FOR DEVELOPING STRESS. Almost all people are at risk of being stressed at some time in their lives. It is important to know that some stress is temporary and some is long term.  Temporary stress will go away when a situation is resolved. Most people can cope with short periods of stress, and it can often be relieved by relaxing, taking a walk or getting any type of exercise, chatting through issues with friends, or having a good night's sleep.  Chronic (long-term, continuous) stress is much harder to deal with. It can be psychologically and emotionally damaging. It can be harmful both for an individual and for friends and family. SYMPTOMS Everyone reacts to stress differently. There are some common effects that help [place] recognize it. In times of extreme stress, people may:  Shake uncontrollably.  Breathe faster and deeper than normal (hyperventilate).  Vomit.  For people with asthma, stress can trigger an attack.  For some people, stress may trigger migraine headaches, ulcers, and body pain. PHYSICAL EFFECTS OF STRESS MAY  INCLUDE:  Loss of energy.  Skin problems.  Aches and pains resulting from tense muscles, including neck ache, backache and tension headaches.  Increased pain from arthritis and other conditions.  Irregular heart beat (palpitations).  Periods of irritability or anger.  Apathy or depression.  Anxiety (feeling uptight or worrying).  Unusual behavior.  Loss of appetite.  Comfort eating.  Lack of concentration.  Loss of, or decreased, sex-drive.  Increased smoking, drinking, or recreational drug use.  For women, missed periods.  Ulcers, joint pain, and muscle pain. Post-traumatic stress is the stress caused by any serious accident, strong emotional damage, or extremely difficult or violent experience such as rape or war. Post-traumatic stress victims can experience mixtures of emotions such as fear, shame, depression, guilt or anger. It may include recurrent memories or images that may be haunting. These feelings can last for weeks, months or even years after the traumatic event that triggered them. Specialized treatment, possibly with medicines and psychological therapies, is available. If stress is causing physical symptoms, severe distress or making it difficult for  you to function as normal, it is worth seeing your caregiver. It is important to remember that although stress is a usual part of life, extreme or prolonged stress can lead to other illnesses that will need treatment. It is better to visit a doctor sooner rather than later. Stress has been linked to the development of high blood pressure and heart disease, as well as insomnia and depression. There is no diagnostic test for stress since everyone reacts to it differently. But a caregiver will be able to spot the physical symptoms, such as:  Headaches.  Shingles.  Ulcers. Emotional distress such as intense worry, low mood or irritability should be detected when the doctor asks pertinent questions to identify any  underlying problems that might be the cause. In case there are physical reasons for the symptoms, the doctor may also want to do some tests to exclude certain conditions. If you feel that you are suffering from stress, try to identify the aspects of your life that are causing it. Sometimes you may not be able to change or avoid them, but even a small change can have a positive ripple effect. A simple lifestyle change can make all the difference. STRATEGIES THAT CAN HELP DEAL WITH STRESS:  Delegating or sharing responsibilities.  Avoiding confrontations.  Learning to be more assertive.  Regular exercise.  Avoid using alcohol or street drugs to cope.  Eating a healthy, balanced diet, rich in fruit and vegetables and proteins.  Finding humor or absurdity in stressful situations.  Never taking on more than you know you can handle comfortably.  Organizing your time better to get as much done as possible.  Talking to friends or family and sharing your thoughts and fears.  Listening to music or relaxation tapes.  Relaxation techniques like deep breathing, meditation, and yoga.  Tensing and then relaxing your muscles, starting at the toes and working up to the head and neck. If you think that you would benefit from help, either in identifying the things that are causing your stress or in learning techniques to help you relax, see a caregiver who is capable of helping you with this. Rather than relying on medications, it is usually better to try and identify the things in your life that are causing stress and try to deal with them. There are many techniques of managing stress including counseling, psychotherapy, aromatherapy, yoga, and exercise. Your caregiver can help you determine what is best for you. Document Released: 02/19/2003 Document Revised: 12/04/2013 Document Reviewed: 01/16/2008 [facility] Patient Information 2015 [place], [place]. This information is not intended to replace advice  given to you by your health care provider. Make sure you discuss any questions you have with your health care provider.

## 2015-05-30 ENCOUNTER — Encounter: Payer: Self-pay | Admitting: Internal Medicine

## 2015-08-04 ENCOUNTER — Encounter: Payer: Self-pay | Admitting: Internal Medicine

## 2015-08-04 ENCOUNTER — Ambulatory Visit (INDEPENDENT_AMBULATORY_CARE_PROVIDER_SITE_OTHER): Payer: BLUE CROSS/BLUE SHIELD | Admitting: Internal Medicine

## 2015-08-04 VITALS — BP 140/82 | Temp 98.4°F | Wt 155.0 lb

## 2015-08-04 DIAGNOSIS — I10 Essential (primary) hypertension: Secondary | ICD-10-CM

## 2015-08-04 DIAGNOSIS — R059 Cough, unspecified: Secondary | ICD-10-CM

## 2015-08-04 DIAGNOSIS — R05 Cough: Secondary | ICD-10-CM

## 2015-08-04 LAB — BASIC METABOLIC PANEL
BUN: 13 mg/dL (ref 6–23)
CHLORIDE: 106 meq/L (ref 96–112)
CO2: 27 mEq/L (ref 19–32)
Calcium: 10 mg/dL (ref 8.4–10.5)
Creatinine, Ser: 0.87 mg/dL (ref 0.40–1.20)
GFR: 86.97 mL/min (ref >60.00)
Glucose, Bld: 87 mg/dL (ref 70–99)
POTASSIUM: 5 meq/L (ref 3.5–5.1)
SODIUM: 139 meq/L (ref 135–145)

## 2015-08-04 LAB — CBC WITH DIFFERENTIAL/PLATELET
BASOS PCT: 0.5 % (ref 0.0–3.0)
Basophils Absolute: 0 10*3/uL (ref 0.0–0.1)
EOS PCT: 2.5 % (ref 0.0–5.0)
Eosinophils Absolute: 0.1 10*3/uL (ref 0.0–0.7)
HEMATOCRIT: 35.5 % — AB (ref 36.0–46.0)
HEMOGLOBIN: 11.9 g/dL — AB (ref 12.0–15.0)
LYMPHS PCT: 40.8 % (ref 12.0–46.0)
Lymphs Abs: 1.6 10*3/uL (ref 0.7–4.0)
MCHC: 33.5 g/dL (ref 30.0–36.0)
MCV: 90.4 fl (ref 78.0–100.0)
MONOS PCT: 10.1 % (ref 3.0–12.0)
Monocytes Absolute: 0.4 10*3/uL (ref 0.1–1.0)
Neutro Abs: 1.8 10*3/uL (ref 1.4–7.7)
Neutrophils Relative %: 46.1 % (ref 43.0–77.0)
Platelets: 303 10*3/uL (ref 150.0–400.0)
RBC: 3.93 Mil/uL (ref 3.87–5.11)
RDW: 12.7 % (ref 11.5–15.5)
WBC: 3.9 10*3/uL — AB (ref 4.0–10.5)

## 2015-08-04 MED ORDER — LISINOPRIL 10 MG PO TABS: 10.0000 mg | ORAL_TABLET | Freq: Every day | ORAL | Status: DC

## 2015-08-04 NOTE — Patient Instructions (Signed)
Continue on the  Low dose lisinopril  For blood pressure control  Repeat bp readings in office was 148- 140/82 84  After better from viral illness then  Send [place] readings ( 14 - 28 ) to decide on further action Goal is blood pressure   Below 140/90 .   If  Not  At goal then we can decide on next step.

## 2015-08-04 NOTE — Progress Notes (Signed)
Pre visit review using our clinic review tool, if applicable. No additional management support is needed unless otherwise documented below in the visit note.  Chief Complaint  Patient presents with  . Follow-up    HPI: Sue Golden 54 y.o. comes  In for 2 month fu of  elevated BP  Taking med   After a week.  Since last visit  No se  . Of med . Ran out 2 days  Ago.  No recent  bp  checks.     Ok readings .    Had chills and vomiting last week like a virus  And now cough . Sore  Fever gone had soe concerns about this  Not yet back to normal  Has ? About meds   Dis develop a cough but with illness   .  ROS: See pertinent positives and negatives per HPI.  Past Medical History  Diagnosis Date  . Anemia   . Chicken pox     Family History  Problem Relation Age of Onset  . Hypertension Mother   . Diabetes Father   . Dementia Sister     Social History   Social History  . Marital Status: Single    Spouse Name: N/A  . Number of Children: N/A  . Years of Education: N/A   Social History Main Topics  . Smoking status: Former Games developer  . Smokeless tobacco: None  . Alcohol Use: No  . Drug Use: None  . Sexual Activity: Not Asked   Other Topics Concern  . None   Social History Narrative   hh of 2    Pet dog   Working [employer] 8 - 5    Cleaning  5 days a week.    No tobacco   caffiene in am    ETOh.   Sis dementia and shares help taking care of her in her 54 s     Outpatient Prescriptions Prior to Visit  Medication Sig Dispense Refill  . lisinopril (PRINIVIL,ZESTRIL) 10 MG tablet Take 1 tablet (10 mg total) by mouth daily. 90 tablet 1   No facility-administered medications prior to visit.     EXAM:  BP 140/82 mmHg  Temp(Src) 98.4 F (36.9 C) (Oral)  Wt 155 lb (70.308 kg)  Body mass index is 26.59 kg/(m^2).  GENERAL: vitals reviewed and listed above, alert, oriented, appears well hydrated and in no acute distress tired  HEENT: atraumatic, conjunctiva   clear, no obvious abnormalities on inspection of external nose and ears OP : no lesion edema or exudate  NECK: no obvious masses on inspection palpation  LUNGS: clear to auscultation bilaterally, no wheezes, rales or rhonchi, good air movement  Abdomen:  Sof,t normal bowel sounds without hepatosplenomegaly, no guarding rebound or masses no CVA tenderness CV: HRRR, no clubbing cyanosis or  peripheral edema nl cap refill  MS: moves all extremities without noticeable focal  abnormality PSYCH: pleasant and cooperative, no obvious depression or anxiety  BP Readings from Last 3 Encounters:  08/04/15 140/82  05/28/15 144/90  10/31/12 158/98   Wt Readings from Last 3 Encounters:  08/04/15 155 lb (70.308 kg)  05/28/15 161 lb 11.2 oz (73.347 kg)  10/31/12 162 lb (73.483 kg)     ASSESSMENT AND PLAN:  Discussed the following assessment and plan:  Essential hypertension - readings up today with ilness not ttkaing reading recnetly but had  some good readings early on . concern about se of med and poss of getting off . ?  s answered  - Plan: Basic metabolic panel  Cough - seems to be related to a viral illnes  nausea chills and cough moinirpexam reassuring disc poss from med but dou bt htis - Plan: CBC with Differential/Platelet Pt asked about pgoing off med in futuredisc this also  Pt very wishing to be off med but agrees bp to be controlled will follow to make sure no se and is controlled  Total visit > 50% spent counseling and coordinating care as indicated in above note and in instructions to patient .   -Patient advised to return or notify health care team  if symptoms worsen ,persist or new concerns arise. In the interim  Patient Instructions  Continue on the  Low dose lisinopril  For blood pressure control  Repeat bp readings in office was 148- 140/82 84  After better from viral illness then  Send [place] readings ( 14 - 28 ) to decide on further action Goal is blood pressure   Below  140/90 .   If  Not  At goal then we can decide on next step.    Neta Mends. Symon Norwood M.D.  Lab Results  Component Value Date   WBC 3.9* 08/04/2015   HGB 11.9* 08/04/2015   HCT 35.5* 08/04/2015   PLT 303.0 08/04/2015   GLUCOSE 87 08/04/2015   CHOL 206* 10/24/2012   TRIG 49.0 10/24/2012   HDL 65.30 10/24/2012   LDLDIRECT 131.2 10/24/2012   LDLCALC 118* 02/03/2010   ALT 16 10/24/2012   AST 17 10/24/2012   NA 139 08/04/2015   K 5.0 08/04/2015   CL 106 08/04/2015   CREATININE 0.87 08/04/2015   BUN 13 08/04/2015   CO2 27 08/04/2015   TSH 1.40 10/24/2012

## 2015-10-23 LAB — HM MAMMOGRAPHY

## 2015-10-29 ENCOUNTER — Encounter: Payer: Self-pay | Admitting: Family Medicine

## 2016-02-04 ENCOUNTER — Telehealth: Payer: Self-pay | Admitting: Family Medicine

## 2016-02-04 ENCOUNTER — Other Ambulatory Visit: Payer: Self-pay | Admitting: Internal Medicine

## 2016-02-04 NOTE — Telephone Encounter (Signed)
Pt has been

## 2016-02-04 NOTE — Telephone Encounter (Signed)
Sent to the pharmacy by e-scribe.  Pt is now due for follow up hypertension.  Will send a message to scheduling.

## 2016-02-04 NOTE — Telephone Encounter (Signed)
Pt now due for hypertension follow up.  Please help her to make that appointment.  Thanks!

## 2016-02-16 ENCOUNTER — Encounter: Payer: Self-pay | Admitting: Internal Medicine

## 2016-02-16 ENCOUNTER — Ambulatory Visit (INDEPENDENT_AMBULATORY_CARE_PROVIDER_SITE_OTHER): Payer: BLUE CROSS/BLUE SHIELD | Admitting: Internal Medicine

## 2016-02-16 VITALS — BP 130/82 | Temp 98.5°F | Wt 157.6 lb

## 2016-02-16 DIAGNOSIS — I10 Essential (primary) hypertension: Secondary | ICD-10-CM

## 2016-02-16 DIAGNOSIS — Z79899 Other long term (current) drug therapy: Secondary | ICD-10-CM

## 2016-02-16 DIAGNOSIS — D649 Anemia, unspecified: Secondary | ICD-10-CM

## 2016-02-16 LAB — BASIC METABOLIC PANEL
BUN: 13 mg/dL (ref 6–23)
CALCIUM: 9.6 mg/dL (ref 8.4–10.5)
CO2: 27 meq/L (ref 19–32)
Chloride: 107 mEq/L (ref 96–112)
Creatinine, Ser: 0.87 mg/dL (ref 0.40–1.20)
GFR: 86.8 mL/min (ref >60.00)
GLUCOSE: 98 mg/dL (ref 70–99)
Potassium: 4.1 mEq/L (ref 3.5–5.1)
SODIUM: 138 meq/L (ref 135–145)

## 2016-02-16 LAB — CBC WITH DIFFERENTIAL/PLATELET
BASOS PCT: 0.4 % (ref 0.0–3.0)
Basophils Absolute: 0 10*3/uL (ref 0.0–0.1)
EOS ABS: 0.1 10*3/uL (ref 0.0–0.7)
Eosinophils Relative: 2.7 % (ref 0.0–5.0)
HEMATOCRIT: 36 % (ref 36.0–46.0)
Hemoglobin: 12.1 g/dL (ref 12.0–15.0)
LYMPHS PCT: 51.5 % — AB (ref 12.0–46.0)
Lymphs Abs: 1.6 10*3/uL (ref 0.7–4.0)
MCHC: 33.8 g/dL (ref 30.0–36.0)
MCV: 89.9 fl (ref 78.0–100.0)
MONO ABS: 0.2 10*3/uL (ref 0.1–1.0)
Monocytes Relative: 7.7 % (ref 3.0–12.0)
NEUTROS ABS: 1.1 10*3/uL — AB (ref 1.4–7.7)
Neutrophils Relative %: 37.7 % — ABNORMAL LOW (ref 43.0–77.0)
PLATELETS: 197 10*3/uL (ref 150.0–400.0)
RBC: 4 Mil/uL (ref 3.87–5.11)
RDW: 12.9 % (ref 11.5–15.5)
WBC: 3 10*3/uL — ABNORMAL LOW (ref 4.0–10.5)

## 2016-02-16 LAB — FERRITIN: FERRITIN: 83.9 ng/mL (ref 10.0–291.0)

## 2016-02-16 NOTE — Progress Notes (Signed)
Pre visit review using our clinic review tool, if applicable. No additional management support is needed unless otherwise documented below in the visit note.  Chief Complaint  Patient presents with  . Follow-up  . Hypertension    HPI: DELAINY MCELHINEY 55 y.o.  comes in for chronic disease/ medication management  BP  Not checking at home   Under stress.  Last check 2 months  Ago . About 135 /90 range . But is Taking medication  No se .  Not a lot of sleep.  No bleeding  Hx of mild anemia  utd on colon  ROS: See pertinent positives and negatives per HPI. No cp sob  had sneezing  Allergy   No pain asks what to tatke   Past Medical History  Diagnosis Date  . Anemia   . Chicken pox     Family History  Problem Relation Age of Onset  . Hypertension Mother   . Diabetes Father   . Dementia Sister     Social History   Social History  . Marital Status: Single    Spouse Name: N/A  . Number of Children: N/A  . Years of Education: N/A   Social History Main Topics  . Smoking status: Former Games developer  . Smokeless tobacco: None  . Alcohol Use: No  . Drug Use: None  . Sexual Activity: Not Asked   Other Topics Concern  . None   Social History Narrative   hh of 2    Pet dog   Working [employer] 8 - 5    Cleaning  5 days a week.    No tobacco   caffiene in am    ETOh.   Sis dementia and shares help taking care of her in her 75 s     Outpatient Prescriptions Prior to Visit  Medication Sig Dispense Refill  . lisinopril (PRINIVIL,ZESTRIL) 10 MG tablet TAKE 1 TABLET BY MOUTH DAILY 90 tablet 0   No facility-administered medications prior to visit.     EXAM:  BP 130/82 mmHg  Temp(Src) 98.5 F (36.9 C) (Oral)  Wt 157 lb 9.6 oz (71.487 kg)  Body mass index is 27.04 kg/(m^2).  GENERAL: vitals reviewed and listed above, alert, oriented, appears well hydrated and in no acute distress HEENT: atraumatic, conjunctiva  clear, no obvious abnormalities on inspection of  external nose and ears NECK: no obvious masses on inspection palpation CV: HRRR, no clubbing cyanosis or  peripheral edema nl cap refill CV rr no g or m  Neg cce  MS: moves all extremities without noticeable focal  abnormality PSYCH: pleasant and cooperative, no obvious depression or anxiety Lab Results  Component Value Date   WBC 3.9* 08/04/2015   HGB 11.9* 08/04/2015   HCT 35.5* 08/04/2015   PLT 303.0 08/04/2015   GLUCOSE 87 08/04/2015   CHOL 206* 10/24/2012   TRIG 49.0 10/24/2012   HDL 65.30 10/24/2012   LDLDIRECT 131.2 10/24/2012   LDLCALC 118* 02/03/2010   ALT 16 10/24/2012   AST 17 10/24/2012   NA 139 08/04/2015   K 5.0 08/04/2015   CL 106 08/04/2015   CREATININE 0.87 08/04/2015   BUN 13 08/04/2015   CO2 27 08/04/2015   TSH 1.40 10/24/2012    ASSESSMENT AND PLAN:  Discussed the following assessment and plan:  Essential hypertension - dsic option o finc dose check home readings  if borderline high advise inc to 20 mg pat can contact us about this - Plan: Basic metabolic  panel  Anemia, mild - utd on colon screen no bleed hx of same recheck today to ensure stabililty - Plan: CBC with Differential/Platelet, Ferritin  Medication management - Plan: Basic metabolic panel  -Patient advised to return or notify health care team  if symptoms worsen ,persist or new concerns arise.  Patient Instructions  Check bp reading  Twice a day for 5 days  And if  135/90 and above   We should increase the dose of medication to 20 mg per day . Contact us about this .   Otherwise   Lab today and continue med and recheck at [facility] in august.    Lynden Carrithers K. Abuk Selleck M.D.

## 2016-02-16 NOTE — Patient Instructions (Signed)
Check bp reading  Twice a day for 5 days  And if  135/90 and above   We should increase the dose of medication to 20 mg per day . Contact us about this .   Otherwise   Lab today and continue med and recheck at [facility] in august.

## 2016-03-19 ENCOUNTER — Other Ambulatory Visit: Payer: Self-pay | Admitting: *Deleted

## 2016-03-19 MED ORDER — LISINOPRIL 10 MG PO TABS: 10.0000 mg | ORAL_TABLET | Freq: Every day | ORAL | Status: DC

## 2016-08-12 ENCOUNTER — Other Ambulatory Visit: Payer: Self-pay | Admitting: Internal Medicine

## 2016-08-12 ENCOUNTER — Other Ambulatory Visit (INDEPENDENT_AMBULATORY_CARE_PROVIDER_SITE_OTHER): Payer: BLUE CROSS/BLUE SHIELD

## 2016-08-12 DIAGNOSIS — Z0000 Encounter for general adult medical examination without abnormal findings: Secondary | ICD-10-CM | POA: Diagnosis not present

## 2016-08-12 LAB — LIPID PANEL
CHOL/HDL RATIO: 3
Cholesterol: 228 mg/dL — ABNORMAL HIGH (ref 0–200)
HDL: 68.2 mg/dL (ref >39.00)
LDL Cholesterol: 148 mg/dL — ABNORMAL HIGH (ref 0–99)
NONHDL: 159.32
Triglycerides: 55 mg/dL (ref 0.0–149.0)
VLDL: 11 mg/dL (ref 0.0–40.0)

## 2016-08-12 LAB — CBC WITH DIFFERENTIAL/PLATELET
BASOS ABS: 0 10*3/uL (ref 0.0–0.1)
Basophils Relative: 0.5 % (ref 0.0–3.0)
EOS ABS: 0.1 10*3/uL (ref 0.0–0.7)
Eosinophils Relative: 3.1 % (ref 0.0–5.0)
HCT: 36 % (ref 36.0–46.0)
Hemoglobin: 12.4 g/dL (ref 12.0–15.0)
LYMPHS ABS: 1.7 10*3/uL (ref 0.7–4.0)
Lymphocytes Relative: 59.2 % — ABNORMAL HIGH (ref 12.0–46.0)
MCHC: 34.5 g/dL (ref 30.0–36.0)
MCV: 89.2 fl (ref 78.0–100.0)
MONO ABS: 0.3 10*3/uL (ref 0.1–1.0)
MONOS PCT: 10 % (ref 3.0–12.0)
NEUTROS ABS: 0.8 10*3/uL — AB (ref 1.4–7.7)
NEUTROS PCT: 27.2 % — AB (ref 43.0–77.0)
PLATELETS: 201 10*3/uL (ref 150.0–400.0)
RBC: 4.03 Mil/uL (ref 3.87–5.11)
RDW: 13.2 % (ref 11.5–15.5)
WBC: 2.9 10*3/uL — ABNORMAL LOW (ref 4.0–10.5)

## 2016-08-12 LAB — BASIC METABOLIC PANEL
BUN: 13 mg/dL (ref 6–23)
CALCIUM: 9.5 mg/dL (ref 8.4–10.5)
CO2: 26 meq/L (ref 19–32)
CREATININE: 0.84 mg/dL (ref 0.40–1.20)
Chloride: 107 mEq/L (ref 96–112)
GFR: 90.23 mL/min (ref >60.00)
GLUCOSE: 95 mg/dL (ref 70–99)
Potassium: 3.9 mEq/L (ref 3.5–5.1)
SODIUM: 138 meq/L (ref 135–145)

## 2016-08-12 LAB — TSH: TSH: 1.19 u[IU]/mL (ref 0.35–4.50)

## 2016-08-12 LAB — HEPATIC FUNCTION PANEL
ALBUMIN: 4.1 g/dL (ref 3.5–5.2)
ALK PHOS: 61 U/L (ref 39–117)
ALT: 15 U/L (ref 0–35)
AST: 16 U/L (ref 0–37)
BILIRUBIN DIRECT: 0 mg/dL (ref 0.0–0.3)
TOTAL PROTEIN: 7.9 g/dL (ref 6.0–8.3)
Total Bilirubin: 0.7 mg/dL (ref 0.2–1.2)

## 2016-08-13 NOTE — Telephone Encounter (Signed)
FILLED FOR 6 MONTHS ON 03/19/2016.  REQUEST IS EARLY

## 2016-08-16 NOTE — Progress Notes (Signed)
Pre visit review using our clinic review tool, if applicable. No additional management support is needed unless otherwise documented below in the visit note.  Chief Complaint  Patient presents with  . Annual Exam    HPI: Patient  Sue Golden  55 y.o. comes in today for Preventive Health Care visit  Not checked at home  Taking bp med  No se.  To have  Right foot surgery dr Doran Durand hope to be scheduled for  October   Health Maintenance  Topic Date Due  . INFLUENZA VACCINE  05/12/2017 (Originally 07/13/2016)  . Hepatitis C Screening  08/17/2017 (Originally 19-Aug-1961)  . HIV Screening  08/17/2017 (Originally 09/12/1975)  . MAMMOGRAM  10/22/2017  . TETANUS/TDAP  04/19/2018  . COLONOSCOPY  05/08/2020   Health Maintenance Review LIFESTYLE:  Exercise:    no Tobacco/ETS: no Alcohol: wine  2 x per week  Sugar beverages:rare  Sleep: 5-6 hours   Busy work    Hours 2 jobs and help with sis with dementia  Drug use: no HH  of 2  1 pet.      ROS:  GEN/ HEENT: No fever, significant weight changes sweats headaches vision problems hearing changes, CV/ PULM; No chest pain shortness of breath cough, syncope,edema  change in exercise tolerance. GI /GU: No adominal pain, vomiting, change in bowel habits. No blood in the stool. No significant GU symptoms. SKIN/HEME: ,no acute skin rashes suspicious lesions or bleeding. No lymphadenopathy, nodules, masses.  NEURO/ PSYCH:  No neurologic signs such as weakness ocss numbness and cold feeling in hand right off and on positional . No depression anxiety. Just stress  IMM/ Allergy: No unusual infections.  Allergy .   REST of 12 system review negative except as per HPI   Past Medical History:  Diagnosis Date  . Anemia   . Chicken pox     Past Surgical History:  Procedure Laterality Date  . CESAREAN SECTION    . PARTIAL HYSTERECTOMY     for bledding 1994    Family History  Problem Relation Age of Onset  . Hypertension Mother   . Diabetes  Father   . Dementia Sister     Social History   Social History  . Marital status: Single    Spouse name: N/A  . Number of children: N/A  . Years of education: N/A   Social History Main Topics  . Smoking status: Former Research scientist (life sciences)  . Smokeless tobacco: Never Used  . Alcohol use 1.2 oz/week    2 Glasses of wine per week  . Drug use: No  . Sexual activity: Not Asked   Other Topics Concern  . None   Social History Narrative   hh of 2    Pet dog   Working [employer] 8 - 5    Cleaning  5 days a week.    No tobacco   caffiene in am    ETOh.   Sis dementia and shares help taking care of her in her 45 s     Outpatient Medications Prior to Visit  Medication Sig Dispense Refill  . lisinopril (PRINIVIL,ZESTRIL) 10 MG tablet Take 1 tablet (10 mg total) by mouth daily. 90 tablet 1   No facility-administered medications prior to visit.      EXAM:  BP 134/80 (BP Location: Right Arm, Patient Position: Sitting, Cuff Size: Normal)   Temp 98.1 F (36.7 C) (Oral)   Ht 5' 4.5" (1.638 m)   Wt 156 lb 9.6 oz (  71 kg)   BMI 26.47 kg/m   Body mass index is 26.47 kg/m.  Physical Exam: Vital signs reviewed BHA:LPFX is a well-developed well-nourished alert cooperative    who appearsr stated age in no acute distress.  HEENT: normocephalic atraumatic , Eyes: PERRL EOM's full, conjunctiva clear, Nares: paten,t no deformity discharge or tenderness., Ears: no deformity EAC's clear TMs with normal landmarks. Mouth: clear OP, no lesions, edema.  Moist mucous membranes. Dentition in adequate repair. NECK: supple without masses, thyromegaly or bruits. CHEST/PULM:  Clear to auscultation and percussion breath sounds equal no wheeze , rales or rhonchi. No chest wall deformities or tenderness. CV: PMI is nondisplaced, S1 S2 no gallops, murmurs, rubs. Peripheral pulses are full without delay.No JVD . Breast: normal by inspection . No dimpling, discharge, masses, tenderness or discharge . ABDOMEN:  Bowel sounds normal nontender  No guard or rebound, no hepato splenomegal no CVA tenderness.  No hernia. Extremtities:  No clubbing cyanosis or edema, no acute joint swelling or redness no focal atrophy right footbunion a d overriding toe deformity no atrophy hands NEURO:  Oriented x3, cranial nerves 3-12 appear to be intact, no obvious focal weakness,gait within normal limits no abnormal reflexes or asymmetrical SKIN: No acute rashes normal turgor, color, no bruising or petechiae. PSYCH: Oriented, good eye contact, no obvious depression anxiety, cognition and judgment appear normal. LN: no cervical axillary inguinal adenopathy  Lab Results  Component Value Date   WBC 2.9 (L) 08/12/2016   HGB 12.4 08/12/2016   HCT 36.0 08/12/2016   PLT 201.0 08/12/2016   GLUCOSE 95 08/12/2016   CHOL 228 (H) 08/12/2016   TRIG 55.0 08/12/2016   HDL 68.20 08/12/2016   LDLDIRECT 131.2 10/24/2012   LDLCALC 148 (H) 08/12/2016   ALT 15 08/12/2016   AST 16 08/12/2016   NA 138 08/12/2016   K 3.9 08/12/2016   CL 107 08/12/2016   CREATININE 0.84 08/12/2016   BUN 13 08/12/2016   CO2 26 08/12/2016   TSH 1.19 08/12/2016   BP Readings from Last 3 Encounters:  08/17/16 134/80  02/16/16 130/82  08/04/15 140/82   Wt Readings from Last 3 Encounters:  08/17/16 156 lb 9.6 oz (71 kg)  02/16/16 157 lb 9.6 oz (71.5 kg)  08/04/15 155 lb (70.3 kg)     ASSESSMENT AND PLAN:  Discussed the following assessment and plan:  Visit for preventive health examination - declined flu vaccine  Leukopenia - neutropenia without sx worsening than last year  normal exam no sx of infection autoimmunie obvious  - Plan: Ambulatory referral to Hematology  Essential hypertension - controlled  low dose  acei  Neutropenia, unspecified type ([place]) - Plan: Ambulatory referral to Hematology  HLD (hyperlipidemia) - ratio 3 lsi advised and follow  Leukopenia  Now absolute neurotropenia   No anemia  Last check  2-16  Will get heme  opinion  Disc with patient . Counseled.  caretaker and 2 jobs  Patient Care Team: Burnis Medin, MD as PCP - General Patient Instructions  Will try to arrange a  Hematology appt   Before your surgery.about the low white blood count .  I don't see any problems on your exam   Or history .   Healthy lifestyle includes : At least 150 minutes of exercise weeks  , weight at healthy levels, which is usually   BMI 19-25. Avoid trans fats and processed foods;  Increase fresh fruits and veges to 5 servings per day. And avoid sweet beverages  including tea and juice. Mediterranean diet with olive oil and nuts have been noted to be heart and brain healthy . Avoid tobacco products . Limit  alcohol to  7 per week for women and 14 servings for men.  Get adequate sleep . Wear seat belts . Don't text and drive .   Health Maintenance, Female Adopting a healthy lifestyle and getting preventive care can go a long way to promote health and wellness. Talk with your health care provider about what schedule of regular examinations is right for you. This is a good chance for you to check in with your provider about disease prevention and staying healthy. In between checkups, there are plenty of things you can do on your own. Experts have done a lot of research about which lifestyle changes and preventive measures are most likely to keep you healthy. Ask your health care provider for more information. WEIGHT AND DIET  Eat a healthy diet  Be sure to include plenty of vegetables, fruits, low-fat dairy products, and lean protein.  Do not eat a lot of foods high in solid fats, added sugars, or salt.  Get regular exercise. This is one of the most important things you can do for your health.  Most adults should exercise for at least 150 minutes each week. The exercise should increase your heart rate and make you sweat (moderate-intensity exercise).  Most adults should also do strengthening exercises at least twice  a week. This is in addition to the moderate-intensity exercise.  Maintain a healthy weight  Body mass index (BMI) is a measurement that can be used to identify possible weight problems. It estimates body fat based on height and weight. Your health care provider can help determine your BMI and help you achieve or maintain a healthy weight.  For females 6 years of age and older:   A BMI below 18.5 is considered underweight.  A BMI of 18.5 to 24.9 is normal.  A BMI of 25 to 29.9 is considered overweight.  A BMI of 30 and above is considered obese.  Watch levels of cholesterol and blood lipids  You should start having your blood tested for lipids and cholesterol at 56 years of age, then have this test every 5 years.  You may need to have your cholesterol levels checked more often if:  Your lipid or cholesterol levels are high.  You are older than 56 years of age.  You are at high risk for heart disease.  CANCER SCREENING   Lung Cancer  Lung cancer screening is recommended for adults 20-74 years old who are at high risk for lung cancer because of a history of smoking.  A yearly low-dose CT scan of the lungs is recommended for people who:  Currently smoke.  Have quit within the past 15 years.  Have at least a 30-pack-year history of smoking. A pack year is smoking an average of one pack of cigarettes a day for 1 year.  Yearly screening should continue until it has been 15 years since you quit.  Yearly screening should stop if you develop a health problem that would prevent you from having lung cancer treatment.  Breast Cancer  Practice breast self-awareness. This means understanding how your breasts normally appear and feel.  It also means doing regular breast self-exams. Let your health care provider know about any changes, no matter how small.  If you are in your 20s or 30s, you should have a clinical breast exam (CBE) by a  health care provider every 1-3 years as  part of a regular health exam.  If you are 71 or older, have a CBE every year. Also consider having a breast X-ray (mammogram) every year.  If you have a family history of breast cancer, talk to your health care provider about genetic screening.  If you are at high risk for breast cancer, talk to your health care provider about having an MRI and a mammogram every year.  Breast cancer gene (BRCA) assessment is recommended for women who have family members with BRCA-related cancers. BRCA-related cancers include:  Breast.  Ovarian.  Tubal.  Peritoneal cancers.  Results of the assessment will determine the need for genetic counseling and BRCA1 and BRCA2 testing. Cervical Cancer Your health care provider may recommend that you be screened regularly for cancer of the pelvic organs (ovaries, uterus, and vagina). This screening involves a pelvic examination, including checking for microscopic changes to the surface of your cervix (Pap test). You may be encouraged to have this screening done every 3 years, beginning at age 31.  For women ages 62-65, health care providers may recommend pelvic exams and Pap testing every 3 years, or they may recommend the Pap and pelvic exam, combined with testing for human papilloma virus (HPV), every 5 years. Some types of HPV increase your risk of cervical cancer. Testing for HPV may also be done on women of any age with unclear Pap test results.  Other health care providers may not recommend any screening for nonpregnant women who are considered low risk for pelvic cancer and who do not have symptoms. Ask your health care provider if a screening pelvic exam is right for you.  If you have had past treatment for cervical cancer or a condition that could lead to cancer, you need Pap tests and screening for cancer for at least 20 years after your treatment. If Pap tests have been discontinued, your risk factors (such as having a new sexual partner) need to be  reassessed to determine if screening should resume. Some women have medical problems that increase the chance of getting cervical cancer. In these cases, your health care provider may recommend more frequent screening and Pap tests. Colorectal Cancer  This type of cancer can be detected and often prevented.  Routine colorectal cancer screening usually begins at 56 years of age and continues through 56 years of age.  Your health care provider may recommend screening at an earlier age if you have risk factors for colon cancer.  Your health care provider may also recommend using home test kits to check for hidden blood in the stool.  A small camera at the end of a tube can be used to examine your colon directly (sigmoidoscopy or colonoscopy). This is done to check for the earliest forms of colorectal cancer.  Routine screening usually begins at age 61.  Direct examination of the colon should be repeated every 5-10 years through 56 years of age. However, you may need to be screened more often if early forms of precancerous polyps or small growths are found. Skin Cancer  Check your skin from head to toe regularly.  Tell your health care provider about any new moles or changes in moles, especially if there is a change in a mole's shape or color.  Also tell your health care provider if you have a mole that is larger than the size of a pencil eraser.  Always use sunscreen. Apply sunscreen liberally and repeatedly throughout the day.  Protect yourself by wearing long sleeves, pants, a wide-brimmed hat, and sunglasses whenever you are outside. HEART DISEASE, DIABETES, AND HIGH BLOOD PRESSURE   High blood pressure causes heart disease and increases the risk of stroke. High blood pressure is more likely to develop in:  People who have blood pressure in the high end of the normal range (130-139/85-89 mm Hg).  People who are overweight or obese.  People who are African American.  If you are  68-7 years of age, have your blood pressure checked every 3-5 years. If you are 72 years of age or older, have your blood pressure checked every year. You should have your blood pressure measured twice--once when you are at a hospital or clinic, and once when you are not at a hospital or clinic. Record the average of the two measurements. To check your blood pressure when you are not at a hospital or clinic, you can use:  An automated blood pressure machine at a pharmacy.  A home blood pressure monitor.  If you are between 68 years and 30 years old, ask your health care provider if you should take aspirin to prevent strokes.  Have regular diabetes screenings. This involves taking a blood sample to check your fasting blood sugar level.  If you are at a normal weight and have a low risk for diabetes, have this test once every three years after 56 years of age.  If you are overweight and have a high risk for diabetes, consider being tested at a younger age or more often. PREVENTING INFECTION  Hepatitis B  If you have a higher risk for hepatitis B, you should be screened for this virus. You are considered at high risk for hepatitis B if:  You were born in a country where hepatitis B is common. Ask your health care provider which countries are considered high risk.  Your parents were born in a high-risk country, and you have not been immunized against hepatitis B (hepatitis B vaccine).  You have HIV or AIDS.  You use needles to inject street drugs.  You live with someone who has hepatitis B.  You have had sex with someone who has hepatitis B.  You get hemodialysis treatment.  You take certain medicines for conditions, including cancer, organ transplantation, and autoimmune conditions. Hepatitis C  Blood testing is recommended for:  Everyone born from [birth year] through 1966.  Anyone with known risk factors for hepatitis C. Sexually transmitted infections (STIs)  You should be screened  for sexually transmitted infections (STIs) including gonorrhea and chlamydia if:  You are sexually active and are younger than 56 years of age.  You are older than 56 years of age and your health care provider tells you that you are at risk for this type of infection.  Your sexual activity has changed since you were last screened and you are at an increased risk for chlamydia or gonorrhea. Ask your health care provider if you are at risk.  If you do not have HIV, but are at risk, it may be recommended that you take a prescription medicine daily to prevent HIV infection. This is called pre-exposure prophylaxis (PrEP). You are considered at risk if:  You are sexually active and do not regularly use condoms or know the HIV status of your partner(s).  You take drugs by injection.  You are sexually active with a partner who has HIV. Talk with your health care provider about whether you are at high risk of being infected  with HIV. If you choose to begin PrEP, you should first be tested for HIV. You should then be tested every 3 months for as long as you are taking PrEP.  PREGNANCY   If you are premenopausal and you may become pregnant, ask your health care provider about preconception counseling.  If you may become pregnant, take 400 to 800 micrograms (mcg) of folic acid every day.  If you want to prevent pregnancy, talk to your health care provider about birth control (contraception). OSTEOPOROSIS AND MENOPAUSE   Osteoporosis is a disease in which the bones lose minerals and strength with aging. This can result in serious bone fractures. Your risk for osteoporosis can be identified using a bone density scan.  If you are 78 years of age or older, or if you are at risk for osteoporosis and fractures, ask your health care provider if you should be screened.  Ask your health care provider whether you should take a calcium or vitamin D supplement to lower your risk for osteoporosis.  Menopause  may have certain physical symptoms and risks.  Hormone replacement therapy may reduce some of these symptoms and risks. Talk to your health care provider about whether hormone replacement therapy is right for you.  HOME CARE INSTRUCTIONS   Schedule regular health, dental, and eye exams.  Stay current with your immunizations.   Do not use any tobacco products including cigarettes, chewing tobacco, or electronic cigarettes.  If you are pregnant, do not drink alcohol.  If you are breastfeeding, limit how much and how often you drink alcohol.  Limit alcohol intake to no more than 1 drink per day for nonpregnant women. One drink equals 12 ounces of beer, 5 ounces of wine, or 1 ounces of hard liquor.  Do not use street drugs.  Do not share needles.  Ask your health care provider for help if you need support or information about quitting drugs.  Tell your health care provider if you often feel depressed.  Tell your health care provider if you have ever been abused or do not feel safe at home.   This information is not intended to replace advice given to you by your health care provider. Make sure you discuss any questions you have with your health care provider.   Document Released: 06/14/2011 Document Revised: 12/20/2014 Document Reviewed: 10/31/2013 Elsevier Interactive Patient Education 2016 [place] K. Panosh M.D.

## 2016-08-17 ENCOUNTER — Encounter: Payer: Self-pay | Admitting: Internal Medicine

## 2016-08-17 ENCOUNTER — Ambulatory Visit (INDEPENDENT_AMBULATORY_CARE_PROVIDER_SITE_OTHER): Payer: BLUE CROSS/BLUE SHIELD | Admitting: Internal Medicine

## 2016-08-17 VITALS — BP 134/80 | Temp 98.1°F | Ht 64.5 in | Wt 156.6 lb

## 2016-08-17 DIAGNOSIS — E785 Hyperlipidemia, unspecified: Secondary | ICD-10-CM | POA: Diagnosis not present

## 2016-08-17 DIAGNOSIS — I10 Essential (primary) hypertension: Secondary | ICD-10-CM | POA: Diagnosis not present

## 2016-08-17 DIAGNOSIS — Z0000 Encounter for general adult medical examination without abnormal findings: Secondary | ICD-10-CM

## 2016-08-17 DIAGNOSIS — D709 Neutropenia, unspecified: Secondary | ICD-10-CM | POA: Diagnosis not present

## 2016-08-17 DIAGNOSIS — D72819 Decreased white blood cell count, unspecified: Secondary | ICD-10-CM | POA: Diagnosis not present

## 2016-08-17 MED ORDER — LISINOPRIL 10 MG PO TABS: 10.0000 mg | ORAL_TABLET | Freq: Every day | ORAL | 2 refills | Status: DC

## 2016-08-17 NOTE — Patient Instructions (Signed)
Will try to arrange a  Hematology appt   Before your surgery.about the low white blood count .  I don't see any problems on your exam   Or history .   Healthy lifestyle includes : At least 150 minutes of exercise weeks  , weight at healthy levels, which is usually   BMI 19-25. Avoid trans fats and processed foods;  Increase fresh fruits and veges to 5 servings per day. And avoid sweet beverages including tea and juice. Mediterranean diet with olive oil and nuts have been noted to be heart and brain healthy . Avoid tobacco products . Limit  alcohol to  7 per week for women and 14 servings for men.  Get adequate sleep . Wear seat belts . Don't text and drive .   Health Maintenance, Female Adopting a healthy lifestyle and getting preventive care can go a long way to promote health and wellness. Talk with your health care provider about what schedule of regular examinations is right for you. This is a good chance for you to check in with your provider about disease prevention and staying healthy. In between checkups, there are plenty of things you can do on your own. Experts have done a lot of research about which lifestyle changes and preventive measures are most likely to keep you healthy. Ask your health care provider for more information. WEIGHT AND DIET  Eat a healthy diet  Be sure to include plenty of vegetables, fruits, low-fat dairy products, and lean protein.  Do not eat a lot of foods high in solid fats, added sugars, or salt.  Get regular exercise. This is one of the most important things you can do for your health.  Most adults should exercise for at least 150 minutes each week. The exercise should increase your heart rate and make you sweat (moderate-intensity exercise).  Most adults should also do strengthening exercises at least twice a week. This is in addition to the moderate-intensity exercise.  Maintain a healthy weight  Body mass index (BMI) is a measurement that can  be used to identify possible weight problems. It estimates body fat based on height and weight. Your health care provider can help determine your BMI and help you achieve or maintain a healthy weight.  For females 56 years of age and older:   A BMI below 18.5 is considered underweight.  A BMI of 18.5 to 24.9 is normal.  A BMI of 25 to 29.9 is considered overweight.  A BMI of 30 and above is considered obese.  Watch levels of cholesterol and blood lipids  You should start having your blood tested for lipids and cholesterol at 56 years of age, then have this test every 5 years.  You may need to have your cholesterol levels checked more often if:  Your lipid or cholesterol levels are high.  You are older than 56 years of age.  You are at high risk for heart disease.  CANCER SCREENING   Lung Cancer  Lung cancer screening is recommended for adults 30-74 years old who are at high risk for lung cancer because of a history of smoking.  A yearly low-dose CT scan of the lungs is recommended for people who:  Currently smoke.  Have quit within the past 15 years.  Have at least a 30-pack-year history of smoking. A pack year is smoking an average of one pack of cigarettes a day for 1 year.  Yearly screening should continue until it has been 15 years  since you quit.  Yearly screening should stop if you develop a health problem that would prevent you from having lung cancer treatment.  Breast Cancer  Practice breast self-awareness. This means understanding how your breasts normally appear and feel.  It also means doing regular breast self-exams. Let your health care provider know about any changes, no matter how small.  If you are in your 56s or 30s, you should have a clinical breast exam (CBE) by a health care provider every 1-3 years as part of a regular health exam.  If you are 56 or older, have a CBE every year. Also consider having a breast X-ray (mammogram) every year.  If  you have a family history of breast cancer, talk to your health care provider about genetic screening.  If you are at high risk for 56ast cancer, talk to your health care provider about having an MRI and a mammogram every year.  Breast cancer gene (BRCA) assessment is recommended for women who have family members with BRCA-related cancers. BRCA-related cancers include:  Breast.  Ovarian.  Tubal.  Peritoneal cancers.  Results of the assessment will determine the need for genetic counseling and BRCA1 and BRCA2 testing. Cervical Cancer Your health care provider may recommend that you be screened regularly for cancer of the pelvic organs (ovaries, uterus, and vagina). This screening involves a pelvic examination, including checking for microscopic changes to the surface of your cervix (Pap test). You may be encouraged to have this screening done every 3 years, beginning at age 13.  For women ages 56-65, health care providers may recommend pelvic exams and Pap testing every 3 years, or they may recommend the Pap and pelvic exam, combined with testing for human papilloma virus (HPV), every 5 years. Some types of HPV increase your risk of cervical cancer. Testing for HPV may also be done on women of any age with unclear Pap test results.  Other health care providers may not recommend any screening for nonpregnant women who are considered low risk for pelvic cancer and who do not have symptoms. Ask your health care provider if a screening pelvic exam is right for you.  If you have had past treatment for cervical cancer or a condition that could lead to cancer, you need Pap tests and screening for cancer for at least 20 years after your treatment. If Pap tests have been discontinued, your risk factors (such as having a new sexual partner) need to be reassessed to determine if screening should resume. Some women have medical problems that increase the chance of getting cervical cancer. In these cases,  your health care provider may recommend more frequent screening and Pap tests. Colorectal Cancer  This type of cancer can be detected and often prevented.  Routine colorectal cancer screening usually begins at 56 years of age and continues through 57 years of age.  Your health care provider may recommend screening at an earlier age if you have risk factors for colon cancer.  Your health care provider may also recommend using home test kits to check for hidden blood in the stool.  A small camera at the end of a tube can be used to examine your colon directly (sigmoidoscopy or colonoscopy). This is done to check for the earliest forms of colorectal cancer.  Routine screening usually begins at age 72.  Direct examination of the colon should be repeated every 5-10 years through 56 years of age. However, you may need to be screened more often if early forms  of precancerous polyps or small growths are found. Skin Cancer  Check your skin from head to toe regularly.  Tell your health care provider about any new moles or changes in moles, especially if there is a change in a mole's shape or color.  Also tell your health care provider if you have a mole that is larger than the size of a pencil eraser.  Always use sunscreen. Apply sunscreen liberally and repeatedly throughout the day.  Protect yourself by wearing long sleeves, pants, a wide-brimmed hat, and sunglasses whenever you are outside. HEART DISEASE, DIABETES, AND HIGH BLOOD PRESSURE   High blood pressure causes heart disease and increases the risk of stroke. High blood pressure is more likely to develop in:  People who have blood pressure in the high end of the normal range (130-139/85-89 mm Hg).  People who are overweight or obese.  People who are African American.  If you are 35-37 years of age, have your blood pressure checked every 3-5 years. If you are 47 years of age or older, have your blood pressure checked every year. You  should have your blood pressure measured twice--once when you are at a hospital or clinic, and once when you are not at a hospital or clinic. Record the average of the two measurements. To check your blood pressure when you are not at a hospital or clinic, you can use:  An automated blood pressure machine at a pharmacy.  A home blood pressure monitor.  If you are between 67 years and 64 years old, ask your health care provider if you should take aspirin to prevent strokes.  Have regular diabetes screenings. This involves taking a blood sample to check your fasting blood sugar level.  If you are at a normal weight and have a low risk for diabetes, have this test once every three years after 56 years of age.  If you are overweight and have a high risk for diabetes, consider being tested at a younger age or more often. PREVENTING INFECTION  Hepatitis B  If you have a higher risk for hepatitis B, you should be screened for this virus. You are considered at high risk for hepatitis B if:  You were born in a country where hepatitis B is common. Ask your health care provider which countries are considered high risk.  Your parents were born in a high-risk country, and you have not been immunized against hepatitis B (hepatitis B vaccine).  You have HIV or AIDS.  You use needles to inject street drugs.  You live with someone who has hepatitis B.  You have had sex with someone who has hepatitis B.  You get hemodialysis treatment.  You take certain medicines for conditions, including cancer, organ transplantation, and autoimmune conditions. Hepatitis C  Blood testing is recommended for:  Everyone born from [birth year] through 1966.  Anyone with known risk factors for hepatitis C. Sexually transmitted infections (STIs)  You should be screened for sexually transmitted infections (STIs) including gonorrhea and chlamydia if:  You are sexually active and are younger than 56 years of age.  You  are older than 56 years of age and your health care provider tells you that you are at risk for this type of infection.  Your sexual activity has changed since you were last screened and you are at an increased risk for chlamydia or gonorrhea. Ask your health care provider if you are at risk.  If you do not have HIV, but are at  risk, it may be recommended that you take a prescription medicine daily to prevent HIV infection. This is called pre-exposure prophylaxis (PrEP). You are considered at risk if:  You are sexually active and do not regularly use condoms or know the HIV status of your partner(s).  You take drugs by injection.  You are sexually active with a partner who has HIV. Talk with your health care provider about whether you are at high risk of being infected with HIV. If you choose to begin PrEP, you should first be tested for HIV. You should then be tested every 3 months for as long as you are taking PrEP.  PREGNANCY   If you are premenopausal and you may become pregnant, ask your health care provider about preconception counseling.  If you may become pregnant, take 400 to 800 micrograms (mcg) of folic acid every day.  If you want to prevent pregnancy, talk to your health care provider about birth control (contraception). OSTEOPOROSIS AND MENOPAUSE   Osteoporosis is a disease in which the bones lose minerals and strength with aging. This can result in serious bone fractures. Your risk for osteoporosis can be identified using a bone density scan.  If you are 69 years of age or older, or if you are at risk for osteoporosis and fractures, ask your health care provider if you should be screened.  Ask your health care provider whether you should take a calcium or vitamin D supplement to lower your risk for osteoporosis.  Menopause may have certain physical symptoms and risks.  Hormone replacement therapy may reduce some of these symptoms and risks. Talk to your health care  provider about whether hormone replacement therapy is right for you.  HOME CARE INSTRUCTIONS   Schedule regular health, dental, and eye exams.  Stay current with your immunizations.   Do not use any tobacco products including cigarettes, chewing tobacco, or electronic cigarettes.  If you are pregnant, do not drink alcohol.  If you are breastfeeding, limit how much and how often you drink alcohol.  Limit alcohol intake to no more than 1 drink per day for nonpregnant women. One drink equals 12 ounces of beer, 5 ounces of wine, or 1 ounces of hard liquor.  Do not use street drugs.  Do not share needles.  Ask your health care provider for help if you need support or information about quitting drugs.  Tell your health care provider if you often feel depressed.  Tell your health care provider if you have ever been abused or do not feel safe at home.   This information is not intended to replace advice given to you by your health care provider. Make sure you discuss any questions you have with your health care provider.   Document Released: 06/14/2011 Document Revised: 12/20/2014 Document Reviewed: 10/31/2013 Elsevier Interactive Patient Education [employer].

## 2016-08-17 NOTE — Addendum Note (Signed)
Addended by: Raj JanusADKINS, Mala Gibbard T on: 08/17/2016 03:29 PM   Modules accepted: Orders

## 2016-08-24 ENCOUNTER — Telehealth: Payer: Self-pay | Admitting: Oncology

## 2016-08-24 ENCOUNTER — Encounter: Payer: Self-pay | Admitting: Oncology

## 2016-08-24 NOTE — Telephone Encounter (Signed)
Pt confirmed appt, completed intake, mailed pt letter, in basket referring provider appt date/time.  °

## 2016-09-21 ENCOUNTER — Ambulatory Visit (HOSPITAL_BASED_OUTPATIENT_CLINIC_OR_DEPARTMENT_OTHER): Payer: BLUE CROSS/BLUE SHIELD | Admitting: Oncology

## 2016-09-21 VITALS — BP 154/95 | HR 92 | Temp 98.3°F | Resp 20 | Ht 64.5 in | Wt 159.0 lb

## 2016-09-21 DIAGNOSIS — I10 Essential (primary) hypertension: Secondary | ICD-10-CM

## 2016-09-21 DIAGNOSIS — D708 Other neutropenia: Secondary | ICD-10-CM

## 2016-09-21 NOTE — Progress Notes (Signed)
Reason for Referral: Leukocytopenia.   HPI: 55 year old woman currently of [place] where she lived the majority of her life. She is a rather healthy woman with history of hypertension but really no other comorbid conditions. She had a CBC done on 08/12/2016 which showed a white cell count total of 2.9 with mild neutropenia and an absolute neutrophil count of 800. Her neutrophil percentage is 27%. She had mild lymphocytosis at 59%. Her hemoglobin and platelet count were normal. Reviewing her CBCs dating back to 2009 she had similar findings. Her total white cell count has fluctuated as low as 2.9 back in 2011 with similar differentiation. She is completely asymptomatic with these findings including no symptoms of fevers, chills, lymphadenopathy or constitutional symptoms. 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 She remains active and working 2 jobs and attends activities of daily living.  She does not report any headaches, blurry vision, syncope or seizures. She does not report any fevers, chills or sweats. Has not reported any weight loss or appetite changes. She does not report any chest pain, palpitation, orthopnea or leg edema. She does not report any cough, wheezing or hemoptysis. She does not report any nausea, vomiting or abdominal pain. She does not report any frequency urgency or hesitancy. He does not report any skeletal complaints of arthralgias or myalgias. Remaining review of systems unremarkable.   Past Medical History:  Diagnosis Date  . Anemia   . Chicken pox   :  Past Surgical History:  Procedure Laterality Date  . CESAREAN SECTION    . PARTIAL HYSTERECTOMY     for bledding 1994  :   Current Outpatient Prescriptions:  .  lisinopril (PRINIVIL,ZESTRIL) 10 MG tablet, Take 1 tablet (10 mg total) by mouth daily., Disp: 90 tablet, Rfl: 2 .  Omega-3 Fatty Acids (FISH OIL) 1000 MG CAPS, Take by mouth., Disp: , Rfl:  :  No Known Allergies:  Family History  Problem Relation Age of Onset  . Hypertension Mother   . Diabetes Father   . Dementia Sister   :  Social History   Social History  . Marital status: Single    Spouse name: N/A  . Number of children: N/A  . Years of education: N/A   Occupational History  . Not on file.   Social History Main Topics  . Smoking status: Former Games developer  . Smokeless tobacco: Never Used  . Alcohol use 1.2 oz/week    2 Glasses of wine per week  . Drug use: No  . Sexual activity: Not on file   Other Topics Concern  . Not on file   Social History Narrative   hh of 2    Pet dog   Working [employer] 8 - 5    Cleaning  5 days a week.    No tobacco   caffiene in am    ETOh.   Sis dementia and shares help taking care of her in her 25 s   :  Pertinent items are noted in HPI.  Exam: Blood pressure (!) 154/95, pulse 92, temperature 98.3 F (36.8 C), temperature source Oral, resp. rate 20, height 5' 4.5" (1.638 m), weight 159 lb (72.1 kg), SpO2 100 %. General appearance: alert and cooperative Head: Normocephalic, without obvious abnormality Throat: lips, mucosa, and tongue normal; teeth and gums normal Neck: no adenopathy Back: negative Resp: clear to auscultation bilaterally Chest wall: no tenderness Cardio: regular rate and rhythm, S1,  S2 normal, no murmur, click, rub or gallop GI: soft, non-tender; bowel sounds normal; no masses,  no organomegaly  CBC    Component Value Date/Time   WBC 2.9 (L) 08/12/2016 0825   RBC 4.03 08/12/2016 0825   HGB 12.4 08/12/2016 0825   HCT 36.0 08/12/2016 0825   PLT 201.0 08/12/2016 0825   MCV 89.2 08/12/2016 0825   MCHC 34.5 08/12/2016 0825   RDW 13.2 08/12/2016 0825   LYMPHSABS 1.7 08/12/2016 0825   MONOABS 0.3 08/12/2016 0825   EOSABS 0.1 08/12/2016 0825   BASOSABS 0.0 08/12/2016 0825     Chemistry      Component Value Date/Time   NA 138 08/12/2016 0825   K 3.9 08/12/2016 0825   CL 107  08/12/2016 0825   CO2 26 08/12/2016 0825   BUN 13 08/12/2016 0825   CREATININE 0.84 08/12/2016 0825      Component Value Date/Time   CALCIUM 9.5 08/12/2016 0825   ALKPHOS 61 08/12/2016 0825   AST 16 08/12/2016 0825   ALT 15 08/12/2016 0825   BILITOT 0.7 08/12/2016 0825       Assessment and Plan:   55 year old woman with leukocytopenia and mild neutropenia. Her total white cell count have ranged between 2.9 and 3.9 and have been fluctuating since 2009. Her absolute neutrophil counts have ranged between normal to 800.  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 Lymphoproliferative disorder, myelodysplasia or other hematological conditions are extremely unlikely. Autoimmune disorder is a possibility but also considered less likely. She is completely asymptomatic and these findings have been very mild, fluctuating and dating back to at least 2009. All these findings suggest benign etiology without any further workup is needed.  I recommended annual CBCs to continue as she is currently doing with her primary care provider. She develops progressive cytopenias, we will reevaluate her case at that time. If she continues to have fluctuating white cell count as she is doing now, no further intervention is needed. If she develops progressively lower white cell count then reevaluation is warranted. She develops anemia or thrombocytopenia in the future also this will warrant reevaluation.  All her questions are answered today and I'm happy to see her in the future if needed.

## 2017-05-20 ENCOUNTER — Other Ambulatory Visit: Payer: Self-pay | Admitting: Internal Medicine

## 2017-08-18 ENCOUNTER — Other Ambulatory Visit: Payer: Self-pay | Admitting: Internal Medicine

## 2017-08-29 ENCOUNTER — Encounter: Payer: Self-pay | Admitting: *Deleted

## 2017-08-29 NOTE — Progress Notes (Signed)
Chief Complaint  Patient presents with  . Annual Exam    Unsuccessful foot surgery - requests referral.     HPI: Patient  Sue Golden  56 y.o. comes in today for Preventive Health Care visit   [place] did surgery and toe still elevated  Right disappointed  Still has toe elevated and cant put foot in shoe .  .   126/82   Taking .  Med   Prefers  None but taking prefers not to increase dose   No infections  Issues bleeding  Fevers gets yearly blood counts .   Scalp see below .  Sis not in nursing home dememtia unknown cause except  Emotional trauma at some point was healthy    Health Maintenance  Topic Date Due  . Hepatitis C Screening  07-Jul-1961  . HIV Screening  09/12/1975  . INFLUENZA VACCINE  03/12/2018 (Originally 07/13/2017)  . MAMMOGRAM  10/22/2017  . TETANUS/TDAP  04/19/2018  . COLONOSCOPY  05/08/2020   Health Maintenance Review LIFESTYLE:  Exercise:   No walking at work  Tobacco/ETS: no Alcohol: rare  Sugar beverages: no Sleep: 6- Drug use: no  HH of  2 1 dog  Work: 55  Hours   ROS:  patc of hair dec on top of head   No fam hx  GEN/ HEENT: No fever, significant weight changes sweats headaches vision problems hearing changes, CV/ PULM; No chest pain shortness of breath cough, syncope,edema  change in exercise tolerance. GI /GU: No adominal pain, vomiting, change in bowel habits. No blood in the stool. No significant GU symptoms. SKIN/HEME: ,no acute skin rashes suspicious lesions or bleeding. No lymphadenopathy, nodules, masses.  NEURO/ PSYCH:  No neurologic signs such as weakness numbness. No depression anxiety. IMM/ Allergy: No unusual infections.  Allergy .   REST of 12 system review negative except as per HPI   Past Medical History:  Diagnosis Date  . Anemia   . Chicken pox     Past Surgical History:  Procedure Laterality Date  . CESAREAN SECTION    . PARTIAL HYSTERECTOMY     for bledding 1994    Family History  Problem Relation Age of  Onset  . Hypertension Mother   . Diabetes Father   . Dementia Sister        in 51s now in nursingg home no dx    Social History   Social History  . Marital status: Single    Spouse name: N/A  . Number of children: N/A  . Years of education: N/A   Social History Main Topics  . Smoking status: Former Research scientist (life sciences)  . Smokeless tobacco: Never Used  . Alcohol use 1.2 oz/week    2 Glasses of wine per week  . Drug use: No  . Sexual activity: Not Asked   Other Topics Concern  . None   Social History Narrative   hh of 2    Pet dog   Working [employer] 8 - 5    Cleaning  5 days a week.    No tobacco   caffiene in am    ETOh.   On feet a lot     Outpatient Medications Prior to Visit  Medication Sig Dispense Refill  . lisinopril (PRINIVIL,ZESTRIL) 10 MG tablet TAKE 1 TABLET DAILY 90 tablet 0  . Omega-3 Fatty Acids (FISH OIL) 1000 MG CAPS Take by mouth.     No facility-administered medications prior to visit.  EXAM:  BP (!) 132/94 (BP Location: Right Arm, Patient Position: Sitting, Cuff Size: Normal)   Pulse 78   Temp 97.9 F (36.6 C) (Oral)   Ht 5' 3.25" (1.607 m)   Wt 165 lb 9.6 oz (75.1 kg)   LMP 12/13/1992 (Approximate)   BMI 29.10 kg/m   Body mass index is 29.1 kg/m. Wt Readings from Last 3 Encounters:  08/30/17 165 lb 9.6 oz (75.1 kg)  09/21/16 159 lb (72.1 kg)  08/17/16 156 lb 9.6 oz (71 kg)    Physical Exam: Vital signs reviewed QVZ:DGLO is a well-developed well-nourished alert cooperative    who appearsr stated age in no acute distress.  HEENT: normocephalic atraumatic , Eyes: PERRL EOM's full, conjunctiva clear, Nares: paten,t no deformity discharge or tenderness., Ears: no deformity EAC's clear TMs with normal landmarks. Mouth: clear OP, no lesions, edema.  Moist mucous membranes. Dentition in adequate repair. NECK: supple without masses, thyromegaly or bruits. CHEST/PULM:  Clear to auscultation and percussion breath sounds equal no wheeze ,  rales or rhonchi. No chest wall deformities or tenderness. Breast: normal by inspection . No dimpling, discharge, masses, tenderness or discharge . CV: PMI is nondisplaced, S1 S2 no gallops, murmurs, rubs. Peripheral pulses are full without delay.No JVD .  ABDOMEN: Bowel sounds normal nontender  No guard or rebound, no hepato splenomegal no CVA tenderness.  No hernia. Extremtities:  No clubbing cyanosis or edema, no acute joint swelling or redness no focal atrophy right foot  Healed second toe with elevation straight  But  in line   no redness  NEURO:  Oriented x3, cranial nerves 3-12 appear to be intact, no obvious focal weakness,gait within normal limits no abnormal reflexes or asymmetrical SKIN: No acute rashes normal turgor, color, no bruising or petechiae. Vertex dec hair in about a 2-3 cm area but no scarring rash  Pulled braids  But not in line with traction   Not boald and no black dot .  PSYCH: Oriented, good eye contact, no obvious depression anxiety, cognition and judgment appear normal. LN: no cervical axillary inguinal adenopathy  Lab Results  Component Value Date   WBC 2.9 (L) 08/12/2016   HGB 12.4 08/12/2016   HCT 36.0 08/12/2016   PLT 201.0 08/12/2016   GLUCOSE 95 08/12/2016   CHOL 228 (H) 08/12/2016   TRIG 55.0 08/12/2016   HDL 68.20 08/12/2016   LDLDIRECT 131.2 10/24/2012   LDLCALC 148 (H) 08/12/2016   ALT 15 08/12/2016   AST 16 08/12/2016   NA 138 08/12/2016   K 3.9 08/12/2016   CL 107 08/12/2016   CREATININE 0.84 08/12/2016   BUN 13 08/12/2016   CO2 26 08/12/2016   TSH 1.19 08/12/2016    BP Readings from Last 3 Encounters:  08/30/17 (!) 132/94  09/21/16 (!) 154/95  08/17/16 134/80   Wt Readings from Last 3 Encounters:  08/30/17 165 lb 9.6 oz (75.1 kg)  09/21/16 159 lb (72.1 kg)  08/17/16 156 lb 9.6 oz (71 kg)    Lab results reviewed with patient   ASSESSMENT AND PLAN:  Discussed the following assessment and plan:  Visit for preventive health  examination - Plan: Basic metabolic panel, CBC with Differential/Platelet, Hepatic function panel, Lipid panel, TSH, T4, free  Essential hypertension - reported controlled  declines increasing dose of med  - Plan: Basic metabolic panel, CBC with Differential/Platelet, Hepatic function panel, Lipid panel, TSH, T4, free  Hyperlipidemia, unspecified hyperlipidemia type - Plan: Basic metabolic panel, CBC with Differential/Platelet, Hepatic  function panel, Lipid panel, TSH, T4, free  Medication management - Plan: Basic metabolic panel, CBC with Differential/Platelet, Hepatic function panel, Lipid panel, TSH, T4, free  Leukopenia, unspecified type - following  heme consult in past  nl b12 in past - Plan: Basic metabolic panel, CBC with Differential/Platelet, Hepatic function panel, Lipid panel, TSH, T4, free  Patchy loss of hair - ? homonal and traction?   if preogresses advise derm eval for other causes - Plan: Basic metabolic panel, CBC with Differential/Platelet, Hepatic function panel, Lipid panel, TSH, T4, free Declined   Flu vaccine   And hep c screening low risk  hx  To get mammogram.  disc caution with supplements    And acknowledge hesitation with  rx meds also if not made in the  [place] The 10-year ASCVD risk score Mikey Bussing DC Brooke Bonito., et al., 2013) is: 5.4%   Values used to calculate the score:     Age: 4 years     Sex: Female     Is Non-Hispanic African American: Yes     Diabetic: No     Tobacco smoker: No     Systolic Blood Pressure: 161 mmHg     Is BP treated: Yes     HDL Cholesterol: 68.2 mg/dL     Total Cholesterol: 228 mg/dL  Patient Care Team: Burnis Medin, MD as PCP - General Patient Instructions  Continue lifestyle intervention healthy eating and exercise . Healthy eating .   Will notify you  of labs when available.   If patch on head getting worse   Then advise see dermatology  checkig thyroid today .  New BP goals closer to 120/80  Take blood pressure readings twice a  day for 7- 10 days and then periodically .To ensure at goal  .Send in readings     We can adjust medication if needed .     Preventive Care 40-64 Years, Female Preventive care refers to lifestyle choices and visits with your health care provider that can promote health and wellness. What does preventive care include?  A yearly physical exam. This is also called an annual well check.  Dental exams once or twice a year.  Routine eye exams. Ask your health care provider how often you should have your eyes checked.  Personal lifestyle choices, including: ? Daily care of your teeth and gums. ? Regular physical activity. ? Eating a healthy diet. ? Avoiding tobacco and drug use. ? Limiting alcohol use. ? Practicing safe sex. ? Taking low-dose aspirin daily starting at age 2. ? Taking vitamin and mineral supplements as recommended by your health care provider. What happens during an annual well check? The services and screenings done by your health care provider during your annual well check will depend on your age, overall health, lifestyle risk factors, and family history of disease. Counseling Your health care provider may ask you questions about your:  Alcohol use.  Tobacco use.  Drug use.  Emotional well-being.  Home and relationship well-being.  Sexual activity.  Eating habits.  Work and work Statistician.  Method of birth control.  Menstrual cycle.  Pregnancy history.  Screening You may have the following tests or measurements:  Height, weight, and BMI.  Blood pressure.  Lipid and cholesterol levels. These may be checked every 5 years, or more frequently if you are over 45 years old.  Skin check.  Lung cancer screening. You may have this screening every year starting at age 12 if you have a  30-pack-year history of smoking and currently smoke or have quit within the past 15 years.  Fecal occult blood test (FOBT) of the stool. You may have this test every  year starting at age 52.  Flexible sigmoidoscopy or colonoscopy. You may have a sigmoidoscopy every 5 years or a colonoscopy every 10 years starting at age 13.  Hepatitis C blood test.  Hepatitis B blood test.  Sexually transmitted disease (STD) testing.  Diabetes screening. This is done by checking your blood sugar (glucose) after you have not eaten for a while (fasting). You may have this done every 1-3 years.  Mammogram. This may be done every 1-2 years. Talk to your health care provider about when you should start having regular mammograms. This may depend on whether you have a family history of breast cancer.  BRCA-related cancer screening. This may be done if you have a family history of breast, ovarian, tubal, or peritoneal cancers.  Pelvic exam and Pap test. This may be done every 3 years starting at age 86. Starting at age 67, this may be done every 5 years if you have a Pap test in combination with an HPV test.  Bone density scan. This is done to screen for osteoporosis. You may have this scan if you are at high risk for osteoporosis.  Discuss your test results, treatment options, and if necessary, the need for more tests with your health care provider. Vaccines Your health care provider may recommend certain vaccines, such as:  Influenza vaccine. This is recommended every year.  Tetanus, diphtheria, and acellular pertussis (Tdap, Td) vaccine. You may need a Td booster every 10 years.  Varicella vaccine. You may need this if you have not been vaccinated.  Zoster vaccine. You may need this after age 18.  Measles, mumps, and rubella (MMR) vaccine. You may need at least one dose of MMR if you were born in 1957 or later. You may also need a second dose.  Pneumococcal 13-valent conjugate (PCV13) vaccine. You may need this if you have certain conditions and were not previously vaccinated.  Pneumococcal polysaccharide (PPSV23) vaccine. You may need one or two doses if you smoke  cigarettes or if you have certain conditions.  Meningococcal vaccine. You may need this if you have certain conditions.  Hepatitis A vaccine. You may need this if you have certain conditions or if you travel or work in places where you may be exposed to hepatitis A.  Hepatitis B vaccine. You may need this if you have certain conditions or if you travel or work in places where you may be exposed to hepatitis B.  Haemophilus influenzae type b (Hib) vaccine. You may need this if you have certain conditions.  Talk to your health care provider about which screenings and vaccines you need and how often you need them. This information is not intended to replace advice given to you by your health care provider. Make sure you discuss any questions you have with your health care provider. Document Released: 12/26/2015 Document Revised: 08/18/2016 Document Reviewed: 09/30/2015 Elsevier Interactive Patient Education  2017 [place] K. Panosh M.D.  Assessment and Plan:   56 year old woman with leukocytopenia and mild neutropenia. Her total white cell count have ranged between 2.9 and 3.9 and have been fluctuating since 2009. Her absolute neutrophil counts have ranged between normal to 800.  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 Lymphoproliferative  disorder, myelodysplasia or other hematological conditions are extremely unlikely. Autoimmune disorder is a possibility but also considered less likely. She is completely asymptomatic and these findings have been very mild, fluctuating and dating back to at least 2009. All these findings suggest benign etiology without any further workup is needed.  I recommended annual CBCs to continue as she is currently doing with her primary care provider. She develops progressive cytopenias, we will reevaluate her case at that time. If she continues to have  fluctuating white cell count as she is doing now, no further intervention is needed. If she develops progressively lower white cell count then reevaluation is warranted. She develops anemia or thrombocytopenia in the future also this will warrant reevaluation.  All her questions are answered today and I'm happy to see her in the future if needed.

## 2017-08-30 ENCOUNTER — Ambulatory Visit (INDEPENDENT_AMBULATORY_CARE_PROVIDER_SITE_OTHER): Payer: BLUE CROSS/BLUE SHIELD | Admitting: Internal Medicine

## 2017-08-30 ENCOUNTER — Encounter: Payer: Self-pay | Admitting: Internal Medicine

## 2017-08-30 VITALS — BP 132/94 | HR 78 | Temp 97.9°F | Ht 63.25 in | Wt 165.6 lb

## 2017-08-30 DIAGNOSIS — L659 Nonscarring hair loss, unspecified: Secondary | ICD-10-CM | POA: Diagnosis not present

## 2017-08-30 DIAGNOSIS — E785 Hyperlipidemia, unspecified: Secondary | ICD-10-CM

## 2017-08-30 DIAGNOSIS — Z0000 Encounter for general adult medical examination without abnormal findings: Secondary | ICD-10-CM | POA: Diagnosis not present

## 2017-08-30 DIAGNOSIS — Z79899 Other long term (current) drug therapy: Secondary | ICD-10-CM | POA: Diagnosis not present

## 2017-08-30 DIAGNOSIS — D72819 Decreased white blood cell count, unspecified: Secondary | ICD-10-CM | POA: Diagnosis not present

## 2017-08-30 DIAGNOSIS — I10 Essential (primary) hypertension: Secondary | ICD-10-CM

## 2017-08-30 LAB — HEPATIC FUNCTION PANEL
ALT: 10 U/L (ref 0–35)
AST: 11 U/L (ref 0–37)
Albumin: 4.1 g/dL (ref 3.5–5.2)
Alkaline Phosphatase: 64 U/L (ref 39–117)
BILIRUBIN DIRECT: 0.2 mg/dL (ref 0.0–0.3)
BILIRUBIN TOTAL: 0.4 mg/dL (ref 0.2–1.2)
Total Protein: 7.7 g/dL (ref 6.0–8.3)

## 2017-08-30 LAB — LIPID PANEL
CHOLESTEROL: 217 mg/dL — AB (ref 0–200)
HDL: 84.4 mg/dL (ref >39.00)
LDL Cholesterol: 119 mg/dL — ABNORMAL HIGH (ref 0–99)
NONHDL: 132.95
Total CHOL/HDL Ratio: 3
Triglycerides: 72 mg/dL (ref 0.0–149.0)
VLDL: 14.4 mg/dL (ref 0.0–40.0)

## 2017-08-30 LAB — CBC WITH DIFFERENTIAL/PLATELET
Basophils Absolute: 0 10*3/uL (ref 0.0–0.1)
Basophils Relative: 0.5 % (ref 0.0–3.0)
EOS ABS: 0 10*3/uL (ref 0.0–0.7)
EOS PCT: 1.1 % (ref 0.0–5.0)
HCT: 37.7 % (ref 36.0–46.0)
HEMOGLOBIN: 12.3 g/dL (ref 12.0–15.0)
LYMPHS PCT: 42.4 % (ref 12.0–46.0)
Lymphs Abs: 1.6 10*3/uL (ref 0.7–4.0)
MCHC: 32.5 g/dL (ref 30.0–36.0)
MCV: 93.2 fl (ref 78.0–100.0)
MONO ABS: 0.3 10*3/uL (ref 0.1–1.0)
Monocytes Relative: 8.1 % (ref 3.0–12.0)
Neutro Abs: 1.8 10*3/uL (ref 1.4–7.7)
Neutrophils Relative %: 47.9 % (ref 43.0–77.0)
Platelets: 201 10*3/uL (ref 150.0–400.0)
RBC: 4.05 Mil/uL (ref 3.87–5.11)
RDW: 13.1 % (ref 11.5–15.5)
WBC: 3.7 10*3/uL — ABNORMAL LOW (ref 4.0–10.5)

## 2017-08-30 LAB — BASIC METABOLIC PANEL
BUN: 16 mg/dL (ref 6–23)
CO2: 29 mEq/L (ref 19–32)
Calcium: 10 mg/dL (ref 8.4–10.5)
Chloride: 107 mEq/L (ref 96–112)
Creatinine, Ser: 0.85 mg/dL (ref 0.40–1.20)
GFR: 88.67 mL/min (ref >60.00)
Glucose, Bld: 94 mg/dL (ref 70–99)
POTASSIUM: 4.4 meq/L (ref 3.5–5.1)
Sodium: 139 mEq/L (ref 135–145)

## 2017-08-30 LAB — T4, FREE: Free T4: 0.83 ng/dL (ref 0.60–1.60)

## 2017-08-30 LAB — TSH: TSH: 1.69 u[IU]/mL (ref 0.35–4.50)

## 2017-08-30 NOTE — Patient Instructions (Addendum)
Continue lifestyle intervention healthy eating and exercise . Healthy eating .   Will notify you  of labs when available.   If patch on head getting worse   Then advise see dermatology  checkig thyroid today .  New BP goals closer to 120/80  Take blood pressure readings twice a day for 7- 10 days and then periodically .To ensure at goal  .Send in readings     We can adjust medication if needed .     Preventive Care 40-64 Years, Female Preventive care refers to lifestyle choices and visits with your health care provider that can promote health and wellness. What does preventive care include?  A yearly physical exam. This is also called an annual well check.  Dental exams once or twice a year.  Routine eye exams. Ask your health care provider how often you should have your eyes checked.  Personal lifestyle choices, including: ? Daily care of your teeth and gums. ? Regular physical activity. ? Eating a healthy diet. ? Avoiding tobacco and drug use. ? Limiting alcohol use. ? Practicing safe sex. ? Taking low-dose aspirin daily starting at age 62. ? Taking vitamin and mineral supplements as recommended by your health care provider. What happens during an annual well check? The services and screenings done by your health care provider during your annual well check will depend on your age, overall health, lifestyle risk factors, and family history of disease. Counseling Your health care provider may ask you questions about your:  Alcohol use.  Tobacco use.  Drug use.  Emotional well-being.  Home and relationship well-being.  Sexual activity.  Eating habits.  Work and work Astronomer.  Method of birth control.  Menstrual cycle.  Pregnancy history.  Screening You may have the following tests or measurements:  Height, weight, and BMI.  Blood pressure.  Lipid and cholesterol levels. These may be checked every 5 years, or more frequently if you are over 53 years  old.  Skin check.  Lung cancer screening. You may have this screening every year starting at age 70 if you have a 30-pack-year history of smoking and currently smoke or have quit within the past 15 years.  Fecal occult blood test (FOBT) of the stool. You may have this test every year starting at age 55.  Flexible sigmoidoscopy or colonoscopy. You may have a sigmoidoscopy every 5 years or a colonoscopy every 10 years starting at age 64.  Hepatitis C blood test.  Hepatitis B blood test.  Sexually transmitted disease (STD) testing.  Diabetes screening. This is done by checking your blood sugar (glucose) after you have not eaten for a while (fasting). You may have this done every 1-3 years.  Mammogram. This may be done every 1-2 years. Talk to your health care provider about when you should start having regular mammograms. This may depend on whether you have a family history of breast cancer.  BRCA-related cancer screening. This may be done if you have a family history of breast, ovarian, tubal, or peritoneal cancers.  Pelvic exam and Pap test. This may be done every 3 years starting at age 53. Starting at age 25, this may be done every 5 years if you have a Pap test in combination with an HPV test.  Bone density scan. This is done to screen for osteoporosis. You may have this scan if you are at high risk for osteoporosis.  Discuss your test results, treatment options, and if necessary, the need for more tests with  your health care provider. Vaccines Your health care provider may recommend certain vaccines, such as:  Influenza vaccine. This is recommended every year.  Tetanus, diphtheria, and acellular pertussis (Tdap, Td) vaccine. You may need a Td booster every 10 years.  Varicella vaccine. You may need this if you have not been vaccinated.  Zoster vaccine. You may need this after age 59.  Measles, mumps, and rubella (MMR) vaccine. You may need at least one dose of MMR if you were  born in 1957 or later. You may also need a second dose.  Pneumococcal 13-valent conjugate (PCV13) vaccine. You may need this if you have certain conditions and were not previously vaccinated.  Pneumococcal polysaccharide (PPSV23) vaccine. You may need one or two doses if you smoke cigarettes or if you have certain conditions.  Meningococcal vaccine. You may need this if you have certain conditions.  Hepatitis A vaccine. You may need this if you have certain conditions or if you travel or work in places where you may be exposed to hepatitis A.  Hepatitis B vaccine. You may need this if you have certain conditions or if you travel or work in places where you may be exposed to hepatitis B.  Haemophilus influenzae type b (Hib) vaccine. You may need this if you have certain conditions.  Talk to your health care provider about which screenings and vaccines you need and how often you need them. This information is not intended to replace advice given to you by your health care provider. Make sure you discuss any questions you have with your health care provider. Document Released: 12/26/2015 Document Revised: 08/18/2016 Document Reviewed: 09/30/2015 Elsevier Interactive Patient Education  2017 [employer].

## 2017-11-16 ENCOUNTER — Other Ambulatory Visit: Payer: Self-pay | Admitting: Internal Medicine

## 2018-05-16 ENCOUNTER — Other Ambulatory Visit: Payer: Self-pay | Admitting: Internal Medicine

## 2018-08-14 ENCOUNTER — Other Ambulatory Visit: Payer: Self-pay | Admitting: Internal Medicine

## 2018-09-01 NOTE — Progress Notes (Signed)
Chief Complaint  Patient presents with  . Annual Exam    no new complaints.     HPI: Patient  Sue Golden  57 y.o. comes in today for [place] visit   bp  Med   No se of med .  No se of meds   delyaed mammo cause of  Discomfort and? inadequat exam?  utd on colon  No change fam hx  nocv pulm sx  slgit dc   Off and on vagina; Had hsyt for beneign causes   Health Maintenance  Topic Date Due  . Hepatitis C Screening  [birth year]  . HIV Screening  09/12/1975  . MAMMOGRAM  10/22/2017  . TETANUS/TDAP  04/19/2018  . INFLUENZA VACCINE  03/14/2019 (Originally 07/13/2018)  . COLONOSCOPY  05/08/2020   Health Maintenance Review LIFESTYLE:  Exercise:   Some  Activity .   Tobacco/ETS:  no Alcohol: little  Sugar beverages:  Not hardly  Sleep:  Ave  5   Drug use: no HH of   1 dog  Work:54      ROS:  GEN/ HEENT: No fever, significant weight changes sweats headaches vision problems hearing changes, CV/ PULM; No chest pain shortness of breath cough, syncope,edema  change in exercise tolerance. GI /GU: No adominal pain, vomiting, change in bowel habits. No blood in the stool. No significant GU symptoms. SKIN/HEME: ,no acute skin rashes suspicious lesions or bleeding. No lymphadenopathy, nodules, masses.  NEURO/ PSYCH:  No neurologic signs such as weakness numbness. No depression anxiety. IMM/ Allergy: No unusual infections.  Allergy .   REST of 12 system review negative except as per HPI   Past Medical History:  Diagnosis Date  . Anemia   . Chicken pox      Family History  Problem Relation Age of Onset  . Hypertension Mother   . Diabetes Father   . Dementia Sister        in 45s now in nursingg home no dx    Social History   Socioeconomic History  . Marital status: Single    Spouse name: Not on file  . Number of children: Not on file  . Years of education: Not on file  . Highest education level: Not on file  Occupational History  . Not on file    Social Needs  . Financial resource strain: Not on file  . Food insecurity:    Worry: Not on file    Inability: Not on file  . Transportation needs:    Medical: Not on file    Non-medical: Not on file  Tobacco Use  . Smoking status: Former Research scientist (life sciences)  . Smokeless tobacco: Never Used  Substance and Sexual Activity  . Alcohol use: Yes    Alcohol/week: 2.0 standard drinks    Types: 2 Glasses of wine per week  . Drug use: No  . Sexual activity: Not on file  Lifestyle  . Physical activity:    Days per week: Not on file    Minutes per session: Not on file  . Stress: Not on file  Relationships  . Social connections:    Talks on phone: Not on file    Gets together: Not on file    Attends religious service: Not on file    Active member of club or organization: Not on file    Attends meetings of clubs or organizations: Not on file    Relationship status: Not on file  Other Topics Concern  . Not  on file  Social History Narrative   hh of 2    Pet dog   Working [employer] 8 - 5    Cleaning  5 days a week.    No tobacco   caffiene in am    ETOh.   On feet a lot     Outpatient Medications Prior to Visit  Medication Sig Dispense Refill  . lisinopril (PRINIVIL,ZESTRIL) 10 MG tablet TAKE 1 TABLET DAILY 90 tablet 0   No facility-administered medications prior to visit.      EXAM:  BP 132/80 (BP Location: Right Arm, Patient Position: Sitting, Cuff Size: Normal)   Pulse 89   Temp 97.9 F (36.6 C) (Oral)   Ht 5' 3.75" (1.619 m)   Wt 168 lb 1.6 oz (76.2 kg)   LMP 12/13/1992 (Approximate)   BMI 29.08 kg/m   Body mass index is 29.08 kg/m. Wt Readings from Last 3 Encounters:  09/04/18 168 lb 1.6 oz (76.2 kg)  08/30/17 165 lb 9.6 oz (75.1 kg)  09/21/16 159 lb (72.1 kg)    Physical Exam: Vital signs reviewed GEN:This is a well-developed well-nourished alert cooperative    who appearsr stated age in no acute distress.  HEENT: normocephalic atraumatic , Eyes: PERRL  EOM's full, conjunctiva clear, Nares: paten,t no deformity discharge or tenderness., Ears: no deformity EAC's clear TMs with normal landmarks. Mouth: clear OP, no lesions, edema.  Moist mucous membranes. Dentition in adequate repair. NECK: supple without masses, thyromegaly or bruits. CHEST/PULM:  Clear to auscultation and percussion breath sounds equal no wheeze , rales or rhonchi. No chest wall deformities or tenderness. Breast: normal by inspection . No dimpling, discharge, masses, tenderness or discharge . CV: PMI is nondisplaced, S1 S2 no gallops, murmurs, rubs. Peripheral pulses are full without delay.No JVD .  ABDOMEN: Bowel sounds normal nontender  No guard or rebound, no hepato splenomegal no CVA tenderness.  Small umbi hernia Extremtities:  No clubbing cyanosis or edema, no acute joint swelling or redness no focal atrophy NEURO:  Oriented x3, cranial nerves 3-12 appear to be intact, no obvious focal weakness,gait within normal limits no abnormal reflexes or asymmetrical SKIN: No acute rashes normal turgor, color, no bruising or petechiae. Left shin is 3 mm dark flat irreg with brown base  Flecks  Non palpable  PSYCH: Oriented, good eye contact, no obvious depression anxiety, cognition and judgment appear normal. LN: no cervical axillary inguinal adenopathy Pelvic ext gu nl vag nl atropic small white  yellow dc  No lesions  bimanual no masses   Lab Results  Component Value Date   WBC 3.7 (L) 08/30/2017   HGB 12.3 08/30/2017   HCT 37.7 08/30/2017   PLT 201.0 08/30/2017   GLUCOSE 94 08/30/2017   CHOL 217 (H) 08/30/2017   TRIG 72.0 08/30/2017   HDL 84.40 08/30/2017   LDLDIRECT 131.2 10/24/2012   LDLCALC 119 (H) 08/30/2017   ALT 10 08/30/2017   AST 11 08/30/2017   NA 139 08/30/2017   K 4.4 08/30/2017   CL 107 08/30/2017   CREATININE 0.85 08/30/2017   BUN 16 08/30/2017   CO2 29 08/30/2017   TSH 1.69 08/30/2017    BP Readings from Last 3 Encounters:  09/04/18 132/80  08/30/17  (!) 132/94  09/21/16 (!) 154/95    Lab plan  Today   ASSESSMENT AND PLAN:  Discussed the following assessment and plan:  Visit for preventive health examination - Plan: Basic metabolic panel, CBC with Differential/Platelet, Hepatic function panel,   Lipid panel, TSH  Medication management - Plan: Basic metabolic panel, CBC with Differential/Platelet, Hepatic function panel, Lipid panel, TSH  Essential hypertension - reportecd controlled  - Plan: Basic metabolic panel, CBC with Differential/Platelet, Hepatic function panel, Lipid panel, TSH  Leukopenia, unspecified type - Plan: Basic metabolic panel, CBC with Differential/Platelet, Hepatic function panel, Lipid panel, TSH  Elevated LDL cholesterol level - Plan: Basic metabolic panel, CBC with Differential/Platelet, Hepatic function panel, Lipid panel, TSH  Influenza vaccination declined by patient  H/O hysterectomy for benign disease  Pigmented skin lesion of uncertain nature - uncertain if new  refer to derm  - Plan: Ambulatory referral to Dermatology  Encounter for gynecological examination without abnormal finding ge t  mammogram   ?   Patient Care Team: Panosh, Wanda K, MD as PCP - General Patient Instructions  If lab ok then  cpx in a year or as needed   tetanus imm and blood monitoring  Today .  Glad you are doing well. Try getting more sleep .  Mammogram   Will do derm referral about the  Dark mole on your left shin   Not sure if just needs to be watched or   More  Biopsy etc.    Health Maintenance, Female Adopting a healthy lifestyle and getting preventive care can go a long way to promote health and wellness. Talk with your health care provider about what schedule of regular examinations is right for you. This is a good chance for you to check in with your provider about disease prevention and staying healthy. In between checkups, there are plenty of things you can do on your own. Experts have done a lot of research  about which lifestyle changes and preventive measures are most likely to keep you healthy. Ask your health care provider for more information. Weight and diet Eat a healthy diet  Be sure to include plenty of vegetables, fruits, low-fat dairy products, and lean protein.  Do not eat a lot of foods high in solid fats, added sugars, or salt.  Get regular exercise. This is one of the most important things you can do for your health. ? Most adults should exercise for at least 150 minutes each week. The exercise should increase your heart rate and make you sweat (moderate-intensity exercise). ? Most adults should also do strengthening exercises at least twice a week. This is in addition to the moderate-intensity exercise.  Maintain a healthy weight  Body mass index (BMI) is a measurement that can be used to identify possible weight problems. It estimates body fat based on height and weight. Your health care provider can help determine your BMI and help you achieve or maintain a healthy weight.  For females 20 years of age and older: ? A BMI below 18.5 is considered underweight. ? A BMI of 18.5 to 24.9 is normal. ? A BMI of 25 to 29.9 is considered overweight. ? A BMI of 30 and above is considered obese.  Watch levels of cholesterol and blood lipids  You should start having your blood tested for lipids and cholesterol at 58 years of age, then have this test every 5 years.  You may need to have your cholesterol levels checked more often if: ? Your lipid or cholesterol levels are high. ? You are older than 58 years of age. ? You are at high risk for heart disease.  Cancer screening Lung Cancer  Lung cancer screening is recommended for adults 55-80 years old who are at   high risk for lung cancer because of a history of smoking.  A yearly low-dose CT scan of the lungs is recommended for people who: ? Currently smoke. ? Have quit within the past 15 years. ? Have at least a 30-pack-year  history of smoking. A pack year is smoking an average of one pack of cigarettes a day for 1 year.  Yearly screening should continue until it has been 15 years since you quit.  Yearly screening should stop if you develop a health problem that would prevent you from having lung cancer treatment.  Breast Cancer  Practice breast self-awareness. This means understanding how your breasts normally appear and feel.  It also means doing regular breast self-exams. Let your health care provider know about any changes, no matter how small.  If you are in your 20s or 30s, you should have a clinical breast exam (CBE) by a health care provider every 1-3 years as part of a regular health exam.  If you are 40 or older, have a CBE every year. Also consider having a breast X-ray (mammogram) every year.  If you have a family history of breast cancer, talk to your health care provider about genetic screening.  If you are at high risk for breast cancer, talk to your health care provider about having an MRI and a mammogram every year.  Breast cancer gene (BRCA) assessment is recommended for women who have family members with BRCA-related cancers. BRCA-related cancers include: ? Breast. ? Ovarian. ? Tubal. ? Peritoneal cancers.  Results of the assessment will determine the need for genetic counseling and BRCA1 and BRCA2 testing.  Cervical Cancer Your health care provider may recommend that you be screened regularly for cancer of the pelvic organs (ovaries, uterus, and vagina). This screening involves a pelvic examination, including checking for microscopic changes to the surface of your cervix (Pap test). You may be encouraged to have this screening done every 3 years, beginning at age 21.  For women ages 30-65, health care providers may recommend pelvic exams and Pap testing every 3 years, or they may recommend the Pap and pelvic exam, combined with testing for human papilloma virus (HPV), every 5 years.  Some types of HPV increase your risk of cervical cancer. Testing for HPV may also be done on women of any age with unclear Pap test results.  Other health care providers may not recommend any screening for nonpregnant women who are considered low risk for pelvic cancer and who do not have symptoms. Ask your health care provider if a screening pelvic exam is right for you.  If you have had past treatment for cervical cancer or a condition that could lead to cancer, you need Pap tests and screening for cancer for at least 20 years after your treatment. If Pap tests have been discontinued, your risk factors (such as having a new sexual partner) need to be reassessed to determine if screening should resume. Some women have medical problems that increase the chance of getting cervical cancer. In these cases, your health care provider may recommend more frequent screening and Pap tests.  Colorectal Cancer  This type of cancer can be detected and often prevented.  Routine colorectal cancer screening usually begins at 58 years of age and continues through 58 years of age.  Your health care provider may recommend screening at an earlier age if you have risk factors for colon cancer.  Your health care provider may also recommend using home test kits to check for   hidden blood in the stool.  A small camera at the end of a tube can be used to examine your colon directly (sigmoidoscopy or colonoscopy). This is done to check for the earliest forms of colorectal cancer.  Routine screening usually begins at age 33.  Direct examination of the colon should be repeated every 5-10 years through 58 years of age. However, you may need to be screened more often if early forms of precancerous polyps or small growths are found.  Skin Cancer  Check your skin from head to toe regularly.  Tell your health care provider about any new moles or changes in moles, especially if there is a change in a mole's shape or  color.  Also tell your health care provider if you have a mole that is larger than the size of a pencil eraser.  Always use sunscreen. Apply sunscreen liberally and repeatedly throughout the day.  Protect yourself by wearing long sleeves, pants, a wide-brimmed hat, and sunglasses whenever you are outside.  Heart disease, diabetes, and high blood pressure  High blood pressure causes heart disease and increases the risk of stroke. High blood pressure is more likely to develop in: ? People who have blood pressure in the high end of the normal range (130-139/85-89 mm Hg). ? People who are overweight or obese. ? People who are African American.  If you are 1-71 years of age, have your blood pressure checked every 3-5 years. If you are 37 years of age or older, have your blood pressure checked every year. You should have your blood pressure measured twice-once when you are at a hospital or clinic, and once when you are not at a hospital or clinic. Record the average of the two measurements. To check your blood pressure when you are not at a hospital or clinic, you can use: ? An automated blood pressure machine at a pharmacy. ? A home blood pressure monitor.  If you are between 24 years and 12 years old, ask your health care provider if you should take aspirin to prevent strokes.  Have regular diabetes screenings. This involves taking a blood sample to check your fasting blood sugar level. ? If you are at a normal weight and have a low risk for diabetes, have this test once every three years after 58 years of age. ? If you are overweight and have a high risk for diabetes, consider being tested at a younger age or more often. Preventing infection Hepatitis B  If you have a higher risk for hepatitis B, you should be screened for this virus. You are considered at high risk for hepatitis B if: ? You were born in a country where hepatitis B is common. Ask your health care provider which countries  are considered high risk. ? Your parents were born in a high-risk country, and you have not been immunized against hepatitis B (hepatitis B vaccine). ? You have HIV or AIDS. ? You use needles to inject street drugs. ? You live with someone who has hepatitis B. ? You have had sex with someone who has hepatitis B. ? You get hemodialysis treatment. ? You take certain medicines for conditions, including cancer, organ transplantation, and autoimmune conditions.  Hepatitis C  Blood testing is recommended for: ? Everyone born from [birth year] through 1966. ? Anyone with known risk factors for hepatitis C.  Sexually transmitted infections (STIs)  You should be screened for sexually transmitted infections (STIs) including gonorrhea and chlamydia if: ? You are sexually  active and are younger than 58 years of age. ? You are older than 58 years of age and your health care provider tells you that you are at risk for this type of infection. ? Your sexual activity has changed since you were last screened and you are at an increased risk for chlamydia or gonorrhea. Ask your health care provider if you are at risk.  If you do not have HIV, but are at risk, it may be recommended that you take a prescription medicine daily to prevent HIV infection. This is called pre-exposure prophylaxis (PrEP). You are considered at risk if: ? You are sexually active and do not regularly use condoms or know the HIV status of your partner(s). ? You take drugs by injection. ? You are sexually active with a partner who has HIV.  Talk with your health care provider about whether you are at high risk of being infected with HIV. If you choose to begin PrEP, you should first be tested for HIV. You should then be tested every 3 months for as long as you are taking PrEP. Pregnancy  If you are premenopausal and you may become pregnant, ask your health care provider about preconception counseling.  If you may become pregnant, take 400  to 800 micrograms (mcg) of folic acid every day.  If you want to prevent pregnancy, talk to your health care provider about birth control (contraception). Osteoporosis and menopause  Osteoporosis is a disease in which the bones lose minerals and strength with aging. This can result in serious bone fractures. Your risk for osteoporosis can be identified using a bone density scan.  If you are 82 years of age or older, or if you are at risk for osteoporosis and fractures, ask your health care provider if you should be screened.  Ask your health care provider whether you should take a calcium or vitamin D supplement to lower your risk for osteoporosis.  Menopause may have certain physical symptoms and risks.  Hormone replacement therapy may reduce some of these symptoms and risks. Talk to your health care provider about whether hormone replacement therapy is right for you. Follow these instructions at home:  Schedule regular health, dental, and eye exams.  Stay current with your immunizations.  Do not use any tobacco products including cigarettes, chewing tobacco, or electronic cigarettes.  If you are pregnant, do not drink alcohol.  If you are breastfeeding, limit how much and how often you drink alcohol.  Limit alcohol intake to no more than 1 drink per day for nonpregnant women. One drink equals 12 ounces of beer, 5 ounces of wine, or 1 ounces of hard liquor.  Do not use street drugs.  Do not share needles.  Ask your health care provider for help if you need support or information about quitting drugs.  Tell your health care provider if you often feel depressed.  Tell your health care provider if you have ever been abused or do not feel safe at home. This information is not intended to replace advice given to you by your health care provider. Make sure you discuss any questions you have with your health care provider. Document Released: 06/14/2011 Document Revised: 05/06/2016  Document Reviewed: 09/02/2015 [employer] Interactive Patient Education  2018 [place]. Weslee Prestage M.D.

## 2018-09-04 ENCOUNTER — Encounter: Payer: Self-pay | Admitting: Internal Medicine

## 2018-09-04 ENCOUNTER — Ambulatory Visit (INDEPENDENT_AMBULATORY_CARE_PROVIDER_SITE_OTHER): Payer: BLUE CROSS/BLUE SHIELD | Admitting: Internal Medicine

## 2018-09-04 VITALS — BP 132/80 | HR 89 | Temp 97.9°F | Ht 63.75 in | Wt 168.1 lb

## 2018-09-04 DIAGNOSIS — D72819 Decreased white blood cell count, unspecified: Secondary | ICD-10-CM | POA: Diagnosis not present

## 2018-09-04 DIAGNOSIS — Z79899 Other long term (current) drug therapy: Secondary | ICD-10-CM

## 2018-09-04 DIAGNOSIS — Z23 Encounter for immunization: Secondary | ICD-10-CM

## 2018-09-04 DIAGNOSIS — Z90710 Acquired absence of both cervix and uterus: Secondary | ICD-10-CM | POA: Diagnosis not present

## 2018-09-04 DIAGNOSIS — I10 Essential (primary) hypertension: Secondary | ICD-10-CM | POA: Diagnosis not present

## 2018-09-04 DIAGNOSIS — Z01419 Encounter for gynecological examination (general) (routine) without abnormal findings: Secondary | ICD-10-CM

## 2018-09-04 DIAGNOSIS — E7800 Pure hypercholesterolemia, unspecified: Secondary | ICD-10-CM | POA: Diagnosis not present

## 2018-09-04 DIAGNOSIS — Z0000 Encounter for general adult medical examination without abnormal findings: Secondary | ICD-10-CM

## 2018-09-04 DIAGNOSIS — Z2821 Immunization not carried out because of patient refusal: Secondary | ICD-10-CM | POA: Diagnosis not present

## 2018-09-04 DIAGNOSIS — L819 Disorder of pigmentation, unspecified: Secondary | ICD-10-CM

## 2018-09-04 LAB — CBC WITH DIFFERENTIAL/PLATELET
BASOS ABS: 0 10*3/uL (ref 0.0–0.1)
Basophils Relative: 0.4 % (ref 0.0–3.0)
EOS ABS: 0.1 10*3/uL (ref 0.0–0.7)
Eosinophils Relative: 2.1 % (ref 0.0–5.0)
HCT: 36.7 % (ref 36.0–46.0)
Hemoglobin: 12.3 g/dL (ref 12.0–15.0)
LYMPHS ABS: 1.7 10*3/uL (ref 0.7–4.0)
Lymphocytes Relative: 52.6 % — ABNORMAL HIGH (ref 12.0–46.0)
MCHC: 33.6 g/dL (ref 30.0–36.0)
MCV: 91.1 fl (ref 78.0–100.0)
Monocytes Absolute: 0.4 10*3/uL (ref 0.1–1.0)
Monocytes Relative: 13.5 % — ABNORMAL HIGH (ref 3.0–12.0)
NEUTROS ABS: 1 10*3/uL — AB (ref 1.4–7.7)
Neutrophils Relative %: 31.4 % — ABNORMAL LOW (ref 43.0–77.0)
PLATELETS: 204 10*3/uL (ref 150.0–400.0)
RBC: 4.03 Mil/uL (ref 3.87–5.11)
RDW: 13.2 % (ref 11.5–15.5)
WBC: 3.2 10*3/uL — ABNORMAL LOW (ref 4.0–10.5)

## 2018-09-04 LAB — HEPATIC FUNCTION PANEL
ALT: 16 U/L (ref 0–35)
AST: 14 U/L (ref 0–37)
Albumin: 4.2 g/dL (ref 3.5–5.2)
Alkaline Phosphatase: 58 U/L (ref 39–117)
Bilirubin, Direct: 0.1 mg/dL (ref 0.0–0.3)
TOTAL PROTEIN: 7.8 g/dL (ref 6.0–8.3)
Total Bilirubin: 0.3 mg/dL (ref 0.2–1.2)

## 2018-09-04 LAB — LIPID PANEL
CHOL/HDL RATIO: 3
Cholesterol: 211 mg/dL — ABNORMAL HIGH (ref 0–200)
HDL: 67.4 mg/dL (ref >39.00)
LDL Cholesterol: 131 mg/dL — ABNORMAL HIGH (ref 0–99)
NONHDL: 143.1
TRIGLYCERIDES: 63 mg/dL (ref 0.0–149.0)
VLDL: 12.6 mg/dL (ref 0.0–40.0)

## 2018-09-04 LAB — BASIC METABOLIC PANEL
BUN: 21 mg/dL (ref 6–23)
CALCIUM: 10.2 mg/dL (ref 8.4–10.5)
CO2: 28 meq/L (ref 19–32)
CREATININE: 0.81 mg/dL (ref 0.40–1.20)
Chloride: 106 mEq/L (ref 96–112)
GFR: 93.4 mL/min (ref >60.00)
GLUCOSE: 90 mg/dL (ref 70–99)
Potassium: 5.1 mEq/L (ref 3.5–5.1)
SODIUM: 138 meq/L (ref 135–145)

## 2018-09-04 LAB — TSH: TSH: 1.14 u[IU]/mL (ref 0.35–4.50)

## 2018-09-04 NOTE — Patient Instructions (Addendum)
If lab ok then  cpx in a year or as needed   tetanus imm and blood monitoring  Today .  Glad you are doing well. Try getting more sleep .  Mammogram   Will do derm referral about the  Dark mole on your left shin   Not sure if just needs to be watched or   More  Biopsy etc.    Health Maintenance, Female Adopting a healthy lifestyle and getting preventive care can go a long way to promote health and wellness. Talk with your health care provider about what schedule of regular examinations is right for you. This is a good chance for you to check in with your provider about disease prevention and staying healthy. In between checkups, there are plenty of things you can do on your own. Experts have done a lot of research about which lifestyle changes and preventive measures are most likely to keep you healthy. Ask your health care provider for more information. Weight and diet Eat a healthy diet  Be sure to include plenty of vegetables, fruits, low-fat dairy products, and lean protein.  Do not eat a lot of foods high in solid fats, added sugars, or salt.  Get regular exercise. This is one of the most important things you can do for your health. ? Most adults should exercise for at least 150 minutes each week. The exercise should increase your heart rate and make you sweat (moderate-intensity exercise). ? Most adults should also do strengthening exercises at least twice a week. This is in addition to the moderate-intensity exercise.  Maintain a healthy weight  Body mass index (BMI) is a measurement that can be used to identify possible weight problems. It estimates body fat based on height and weight. Your health care provider can help determine your BMI and help you achieve or maintain a healthy weight.  For females 64 years of age and older: ? A BMI below 18.5 is considered underweight. ? A BMI of 18.5 to 24.9 is normal. ? A BMI of 25 to 29.9 is considered overweight. ? A BMI of 30 and above  is considered obese.  Watch levels of cholesterol and blood lipids  You should start having your blood tested for lipids and cholesterol at 58 years of age, then have this test every 5 years.  You may need to have your cholesterol levels checked more often if: ? Your lipid or cholesterol levels are high. ? You are older than 58 years of age. ? You are at high risk for heart disease.  Cancer screening Lung Cancer  Lung cancer screening is recommended for adults 82-30 years old who are at high risk for lung cancer because of a history of smoking.  A yearly low-dose CT scan of the lungs is recommended for people who: ? Currently smoke. ? Have quit within the past 15 years. ? Have at least a 30-pack-year history of smoking. A pack year is smoking an average of one pack of cigarettes a day for 1 year.  Yearly screening should continue until it has been 15 years since you quit.  Yearly screening should stop if you develop a health problem that would prevent you from having lung cancer treatment.  Breast Cancer  Practice breast self-awareness. This means understanding how your breasts normally appear and feel.  It also means doing regular breast self-exams. Let your health care provider know about any changes, no matter how small.  If you are in your 20s or 30s,  you should have a clinical breast exam (CBE) by a health care provider every 1-3 years as part of a regular health exam.  If you are 29 or older, have a CBE every year. Also consider having a breast X-ray (mammogram) every year.  If you have a family history of breast cancer, talk to your health care provider about genetic screening.  If you are at high risk for breast cancer, talk to your health care provider about having an MRI and a mammogram every year.  Breast cancer gene (BRCA) assessment is recommended for women who have family members with BRCA-related cancers. BRCA-related cancers  include: ? Breast. ? Ovarian. ? Tubal. ? Peritoneal cancers.  Results of the assessment will determine the need for genetic counseling and BRCA1 and BRCA2 testing.  Cervical Cancer Your health care provider may recommend that you be screened regularly for cancer of the pelvic organs (ovaries, uterus, and vagina). This screening involves a pelvic examination, including checking for microscopic changes to the surface of your cervix (Pap test). You may be encouraged to have this screening done every 3 years, beginning at age 2.  For women ages 18-65, health care providers may recommend pelvic exams and Pap testing every 3 years, or they may recommend the Pap and pelvic exam, combined with testing for human papilloma virus (HPV), every 5 years. Some types of HPV increase your risk of cervical cancer. Testing for HPV may also be done on women of any age with unclear Pap test results.  Other health care providers may not recommend any screening for nonpregnant women who are considered low risk for pelvic cancer and who do not have symptoms. Ask your health care provider if a screening pelvic exam is right for you.  If you have had past treatment for cervical cancer or a condition that could lead to cancer, you need Pap tests and screening for cancer for at least 20 years after your treatment. If Pap tests have been discontinued, your risk factors (such as having a new sexual partner) need to be reassessed to determine if screening should resume. Some women have medical problems that increase the chance of getting cervical cancer. In these cases, your health care provider may recommend more frequent screening and Pap tests.  Colorectal Cancer  This type of cancer can be detected and often prevented.  Routine colorectal cancer screening usually begins at 58 years of age and continues through 58 years of age.  Your health care provider may recommend screening at an earlier age if you have risk factors  for colon cancer.  Your health care provider may also recommend using home test kits to check for hidden blood in the stool.  A small camera at the end of a tube can be used to examine your colon directly (sigmoidoscopy or colonoscopy). This is done to check for the earliest forms of colorectal cancer.  Routine screening usually begins at age 47.  Direct examination of the colon should be repeated every 5-10 years through 58 years of age. However, you may need to be screened more often if early forms of precancerous polyps or small growths are found.  Skin Cancer  Check your skin from head to toe regularly.  Tell your health care provider about any new moles or changes in moles, especially if there is a change in a mole's shape or color.  Also tell your health care provider if you have a mole that is larger than the size of a pencil eraser.  Always use sunscreen. Apply sunscreen liberally and repeatedly throughout the day.  Protect yourself by wearing long sleeves, pants, a wide-brimmed hat, and sunglasses whenever you are outside.  Heart disease, diabetes, and high blood pressure  High blood pressure causes heart disease and increases the risk of stroke. High blood pressure is more likely to develop in: ? People who have blood pressure in the high end of the normal range (130-139/85-89 mm Hg). ? People who are overweight or obese. ? People who are African American.  If you are 54-75 years of age, have your blood pressure checked every 3-5 years. If you are 36 years of age or older, have your blood pressure checked every year. You should have your blood pressure measured twice-once when you are at a hospital or clinic, and once when you are not at a hospital or clinic. Record the average of the two measurements. To check your blood pressure when you are not at a hospital or clinic, you can use: ? An automated blood pressure machine at a pharmacy. ? A home blood pressure monitor.  If  you are between 36 years and 38 years old, ask your health care provider if you should take aspirin to prevent strokes.  Have regular diabetes screenings. This involves taking a blood sample to check your fasting blood sugar level. ? If you are at a normal weight and have a low risk for diabetes, have this test once every three years after 58 years of age. ? If you are overweight and have a high risk for diabetes, consider being tested at a younger age or more often. Preventing infection Hepatitis B  If you have a higher risk for hepatitis B, you should be screened for this virus. You are considered at high risk for hepatitis B if: ? You were born in a country where hepatitis B is common. Ask your health care provider which countries are considered high risk. ? Your parents were born in a high-risk country, and you have not been immunized against hepatitis B (hepatitis B vaccine). ? You have HIV or AIDS. ? You use needles to inject street drugs. ? You live with someone who has hepatitis B. ? You have had sex with someone who has hepatitis B. ? You get hemodialysis treatment. ? You take certain medicines for conditions, including cancer, organ transplantation, and autoimmune conditions.  Hepatitis C  Blood testing is recommended for: ? Everyone born from [birth year] through 1966. ? Anyone with known risk factors for hepatitis C.  Sexually transmitted infections (STIs)  You should be screened for sexually transmitted infections (STIs) including gonorrhea and chlamydia if: ? You are sexually active and are younger than 58 years of age. ? You are older than 58 years of age and your health care provider tells you that you are at risk for this type of infection. ? Your sexual activity has changed since you were last screened and you are at an increased risk for chlamydia or gonorrhea. Ask your health care provider if you are at risk.  If you do not have HIV, but are at risk, it may be recommended  that you take a prescription medicine daily to prevent HIV infection. This is called pre-exposure prophylaxis (PrEP). You are considered at risk if: ? You are sexually active and do not regularly use condoms or know the HIV status of your partner(s). ? You take drugs by injection. ? You are sexually active with a partner who has HIV.  Talk with  your health care provider about whether you are at high risk of being infected with HIV. If you choose to begin PrEP, you should first be tested for HIV. You should then be tested every 3 months for as long as you are taking PrEP. Pregnancy  If you are premenopausal and you may become pregnant, ask your health care provider about preconception counseling.  If you may become pregnant, take 400 to 800 micrograms (mcg) of folic acid every day.  If you want to prevent pregnancy, talk to your health care provider about birth control (contraception). Osteoporosis and menopause  Osteoporosis is a disease in which the bones lose minerals and strength with aging. This can result in serious bone fractures. Your risk for osteoporosis can be identified using a bone density scan.  If you are 6 years of age or older, or if you are at risk for osteoporosis and fractures, ask your health care provider if you should be screened.  Ask your health care provider whether you should take a calcium or vitamin D supplement to lower your risk for osteoporosis.  Menopause may have certain physical symptoms and risks.  Hormone replacement therapy may reduce some of these symptoms and risks. Talk to your health care provider about whether hormone replacement therapy is right for you. Follow these instructions at home:  Schedule regular health, dental, and eye exams.  Stay current with your immunizations.  Do not use any tobacco products including cigarettes, chewing tobacco, or electronic cigarettes.  If you are pregnant, do not drink alcohol.  If you are  breastfeeding, limit how much and how often you drink alcohol.  Limit alcohol intake to no more than 1 drink per day for nonpregnant women. One drink equals 12 ounces of beer, 5 ounces of wine, or 1 ounces of hard liquor.  Do not use street drugs.  Do not share needles.  Ask your health care provider for help if you need support or information about quitting drugs.  Tell your health care provider if you often feel depressed.  Tell your health care provider if you have ever been abused or do not feel safe at home. This information is not intended to replace advice given to you by your health care provider. Make sure you discuss any questions you have with your health care provider. Document Released: 06/14/2011 Document Revised: 05/06/2016 Document Reviewed: 09/02/2015 Elsevier Interactive Patient Education  [employer].

## 2018-09-04 NOTE — Addendum Note (Signed)
Addended by: Maisie FusGREEN, Zelina Jimerson M on: 09/04/2018 10:24 AM   Modules accepted: Orders

## 2018-11-12 ENCOUNTER — Other Ambulatory Visit: Payer: Self-pay | Admitting: Internal Medicine

## 2018-12-04 LAB — HM MAMMOGRAPHY

## 2018-12-27 ENCOUNTER — Encounter: Payer: Self-pay | Admitting: Internal Medicine

## 2019-01-23 ENCOUNTER — Other Ambulatory Visit: Payer: Self-pay | Admitting: Sports Medicine

## 2019-01-23 ENCOUNTER — Ambulatory Visit (INDEPENDENT_AMBULATORY_CARE_PROVIDER_SITE_OTHER): Payer: BLUE CROSS/BLUE SHIELD

## 2019-01-23 ENCOUNTER — Encounter: Payer: Self-pay | Admitting: Sports Medicine

## 2019-01-23 ENCOUNTER — Ambulatory Visit (INDEPENDENT_AMBULATORY_CARE_PROVIDER_SITE_OTHER): Payer: BLUE CROSS/BLUE SHIELD | Admitting: Sports Medicine

## 2019-01-23 VITALS — BP 155/95 | HR 80

## 2019-01-23 DIAGNOSIS — M2041 Other hammer toe(s) (acquired), right foot: Secondary | ICD-10-CM | POA: Diagnosis not present

## 2019-01-23 DIAGNOSIS — M79671 Pain in right foot: Secondary | ICD-10-CM

## 2019-01-23 DIAGNOSIS — M2011 Hallux valgus (acquired), right foot: Secondary | ICD-10-CM

## 2019-01-23 NOTE — Progress Notes (Signed)
Subjective: Sue Golden is a 58 y.o. female patient who presents to office for evaluation of Right> Left foot pain. Patient complains of progressive pain especially over the last year in the Right>Left foot that starts as pain over the 1st toe and especially to 2nd toe with the elevation and with the 2nd toe rubbing 3rd and getting callus that worsens with direct pressure and range of motion; patient now has difficulty fitting shoes comfortably. Ranks pain as more of a discomfort and is now interferring with daily activities. Patient had surgery in Oct. 2017 and is not happy with the outcome.  Patient has also tried changing shoes and skin cream for callus. Patient denies any other pedal complaints.   Review of Systems  All other systems reviewed and are negative.    Patient Active Problem List   Diagnosis Date Noted  . H/O hysterectomy for benign disease 11/02/2012  . Capsulitis of ankle or foot 07/14/2011  . FREQUENCY, URINARY 07/01/2010  . Leukocytopenia 03/20/2010  . IRON DEFICIENCY 02/20/2010  . HEADACHE 02/10/2010  . URINALYSIS, ABNORMAL 02/10/2010  . OSTEOPOROSIS 04/19/2008    Current Outpatient Medications on File Prior to Visit  Medication Sig Dispense Refill  . lisinopril (PRINIVIL,ZESTRIL) 10 MG tablet TAKE 1 TABLET DAILY 90 tablet 4   No current facility-administered medications on file prior to visit.     No Known Allergies  Objective:  General: Alert and oriented x3 in no acute distress  Dermatology: Scar contracture at right 2nd toe, otherwise areas are well healed from previous surgery on right foot. No open lesions bilateral lower extremities, no webspace macerations, no ecchymosis bilateral, all nails x 10 are well manicured.  Vascular: Dorsalis Pedis and Posterior Tibial pedal pulses 2/4, Capillary Fill Time 3 seconds, (+) pedal hair growth bilateral, no edema bilateral lower extremities, Temperature gradient within normal limits.  Neurology: Michaell Cowing sensation  intact via light touch bilateral.  Musculoskeletal: Mild tenderness with palpation right 1st toe with dorsal elevation noted of the 2nd toe and lesser 3-5 hammertoe. + Pes planus.   Gait: Non-Antalgic gait with increased medial arch collapse and pronatory influence noted on Right> Left foot with medial 1st MTPJ roll-off at toe-off, heel off within normal limits.   Xrays  Right foot    Impression: Hardware intact evidence of lapidus procedure and 2nd toe and metatarsal screw present, there is still hallux pronation and crossover to 2nd toe and lesser 3-5 DIPJ contracture consistent with hammertoe. Midtarsal breach supportive of pes planus. No other acute findings.      Assessment and Plan: Problem List Items Addressed This Visit    None    Visit Diagnoses    Pain in right foot    -  Primary   Acquired hallux valgus of right foot       Hammer toe of right foot           -Complete examination performed -Xrays reviewed -Discussed treatement options; discussed residual hallux and hammertoe deformity;conservative and  Surgical management; risks, benefits, alternatives discussed. All patient's questions answered. -Advised patient to get previous notes/op report from Dr. Victorino Dike for me to review. Like may benefit from revision surgery to her toes.   -Recommend toe alignment splints consistently for the next month  -Recommend continue with good supportive shoes and inserts.  -Patient to return to office in 1 month for possible surgery consult or sooner if condition worsens.  Asencion Islam, DPM

## 2019-01-24 ENCOUNTER — Telehealth: Payer: Self-pay | Admitting: *Deleted

## 2019-01-24 NOTE — Telephone Encounter (Signed)
-----   Message from Asencion Islam, [place] sent at 01/23/2019  7:24 PM EST ----- Regarding: Send Notes from Dr. Barbaraann Share Val  Will you contact patient to let her know that I do not have any access in our Epic charting system or with Care everywhere to see Dr. Laverta Baltimore notes. Please have patient to contact Dr. Laverta Baltimore office to request records to be sent to [place]. Thanks Dr. Marylene Land

## 2019-01-24 NOTE — Telephone Encounter (Signed)
Left message on pt's home/mobile phone to contact Dr. Victorino Dike for his clinicals for Dr. Marylene Land to review.

## 2019-02-20 ENCOUNTER — Encounter: Payer: Self-pay | Admitting: Sports Medicine

## 2019-02-20 ENCOUNTER — Telehealth: Payer: Self-pay | Admitting: *Deleted

## 2019-02-20 ENCOUNTER — Telehealth: Payer: Self-pay | Admitting: Sports Medicine

## 2019-02-20 ENCOUNTER — Ambulatory Visit (INDEPENDENT_AMBULATORY_CARE_PROVIDER_SITE_OTHER): Payer: BLUE CROSS/BLUE SHIELD | Admitting: Sports Medicine

## 2019-02-20 DIAGNOSIS — M2011 Hallux valgus (acquired), right foot: Secondary | ICD-10-CM

## 2019-02-20 DIAGNOSIS — M79671 Pain in right foot: Secondary | ICD-10-CM

## 2019-02-20 DIAGNOSIS — M2041 Other hammer toe(s) (acquired), right foot: Secondary | ICD-10-CM

## 2019-02-20 NOTE — Telephone Encounter (Signed)
Left message for patient at [phone] (cell #) to let them know that we did receive paperwork from Dr. Victorino Dike from [facility] today and that Dr. Marylene Land will be reviewing her notes.

## 2019-02-20 NOTE — Telephone Encounter (Signed)
I'm calling in reference to medical records that should have been sent to you all. I will have them re-faxed to you. Also, these records were e-mailed to you on 03 March at 2:42 pm. If you have any other questions, please just give me a call.

## 2019-02-20 NOTE — Progress Notes (Signed)
Patient presented to office to discuss treatment options for her right foot pain secondary to residual digital deformity after having surgery in October 2017 with Dr. Victorino Dike at [facility] orth[facility].  Patient is here to discuss definitive plan and additional approach after last visit I requested that patient have her medical records sent from [facility] orth[facility] however at patient encounter the reports were not made available to me thus I discussed with patient that I did not have any of her surgical reports therefore it is in her best interest to have her to reschedule to come back at another time for [place] to discuss these notes from Dr. Victorino Dike and definitive plan however I did give patient a idea based on her last x-ray in our office on what I think needed to be corrected however will review these orthopedic notes and develop a treatment course and plan of care.  Patient expressed understanding but also frustration because the charts have not been received by my office.  I reassured patient that my office will try to work on her behalf to also help obtain these records of which we were able to obtain later in the day and we have notified the patient that we have received them and left a voicemail for her to call office to be reschedule back for a further discussion and surgery consult for her right foot. -Dr. Marylene Land

## 2019-08-07 ENCOUNTER — Ambulatory Visit (INDEPENDENT_AMBULATORY_CARE_PROVIDER_SITE_OTHER): Payer: BC Managed Care – PPO | Admitting: Sports Medicine

## 2019-08-07 ENCOUNTER — Other Ambulatory Visit: Payer: Self-pay

## 2019-08-07 ENCOUNTER — Encounter: Payer: Self-pay | Admitting: Sports Medicine

## 2019-08-07 VITALS — Temp 98.0°F

## 2019-08-07 DIAGNOSIS — M7741 Metatarsalgia, right foot: Secondary | ICD-10-CM

## 2019-08-07 DIAGNOSIS — M2041 Other hammer toe(s) (acquired), right foot: Secondary | ICD-10-CM | POA: Diagnosis not present

## 2019-08-07 DIAGNOSIS — M2011 Hallux valgus (acquired), right foot: Secondary | ICD-10-CM | POA: Diagnosis not present

## 2019-08-07 DIAGNOSIS — M79671 Pain in right foot: Secondary | ICD-10-CM

## 2019-08-07 NOTE — Patient Instructions (Signed)
Pre-Operative Instructions  Congratulations, you have decided to take an important step towards improving your quality of life.  You can be assured that the doctors and staff at [facility] will be with you every step of the way.  Here are some important things you should know:  1. Plan to be at the surgery center/hospital at least 1 (one) hour prior to your scheduled time, unless otherwise directed by the surgical center/hospital staff.  You must have a responsible adult accompany you, remain during the surgery and drive you home.  Make sure you have directions to the surgical center/hospital to ensure you arrive on time. 2. If you are having surgery at [facility] or [facility], you will need a copy of your medical history and physical form from your family physician within one month prior to the date of surgery. We will give you a form for your primary physician to complete.  3. We make every effort to accommodate the date you request for surgery.  However, there are times where surgery dates or times have to be moved.  We will contact you as soon as possible if a change in schedule is required.   4. No aspirin/ibuprofen for one week before surgery.  If you are on aspirin, any non-steroidal anti-inflammatory medications (Mobic, Aleve, Ibuprofen) should not be taken seven (7) days prior to your surgery.  You make take Tylenol for pain prior to surgery.  5. Medications - If you are taking daily heart and blood pressure medications, seizure, reflux, allergy, asthma, anxiety, pain or diabetes medications, make sure you notify the surgery center/hospital before the day of surgery so they can tell you which medications you should take or avoid the day of surgery. 6. No food or drink after midnight the night before surgery unless directed otherwise by surgical center/hospital staff. 7. No alcoholic beverages 24-hours prior to surgery.  No smoking 24-hours prior or 24-hours after  surgery. 8. Wear loose pants or shorts. They should be loose enough to fit over bandages, boots, and casts. 9. Don't wear slip-on shoes. Sneakers are preferred. 10. Bring your boot with you to the surgery center/hospital.  Also bring crutches or a walker if your physician has prescribed it for you.  If you do not have this equipment, it will be provided for you after surgery. 11. If you have not been contacted by the surgery center/hospital by the day before your surgery, call to confirm the date and time of your surgery. 12. Leave-time from work may vary depending on the type of surgery you have.  Appropriate arrangements should be made prior to surgery with your employer. 13. Prescriptions will be provided immediately following surgery by your doctor.  Fill these as soon as possible after surgery and take the medication as directed. Pain medications will not be refilled on weekends and must be approved by the doctor. 14. Remove nail polish on the operative foot and avoid getting pedicures prior to surgery. 15. Wash the night before surgery.  The night before surgery wash the foot and leg well with water and the antibacterial soap provided. Be sure to pay special attention to beneath the toenails and in between the toes.  Wash for at least three (3) minutes. Rinse thoroughly with water and dry well with a towel.  Perform this wash unless told not to do so by your physician.  Enclosed: 1 Ice pack (please put in freezer the night before surgery)   1 Hibiclens skin cleaner     Pre-op instructions  If you have any questions regarding the instructions, please do not hesitate to call our office.  [place]: [address], [place], [place] [address] -- [phone]  [place]: [address] , [place] [address] -- [phone]  [place]: [address]  [place], [place] [address] -- [phone]  [place]: [address], [address], [place], [place] [address] -- [phone]  Website:  [email or web address] 

## 2019-08-07 NOTE — Progress Notes (Signed)
Subjective: Sue Golden is a 58 y.o. female patient who presents to office for evaluation of Right> Left foot pain. Patient reports that she has some discomfort especially at right 2nd toe and wants to discuss surgery again now that I have had a change to review charts from Dr. Doran Durand. Patient denies changes with medical history since last encounter.   Patient Active Problem List   Diagnosis Date Noted  . H/O hysterectomy for benign disease 11/02/2012  . Capsulitis of ankle or foot 07/14/2011  . FREQUENCY, URINARY 07/01/2010  . Leukocytopenia 03/20/2010  . IRON DEFICIENCY 02/20/2010  . HEADACHE 02/10/2010  . URINALYSIS, ABNORMAL 02/10/2010  . OSTEOPOROSIS 04/19/2008    Current Outpatient Medications on File Prior to Visit  Medication Sig Dispense Refill  . lisinopril (PRINIVIL,ZESTRIL) 10 MG tablet TAKE 1 TABLET DAILY 90 tablet 4   No current facility-administered medications on file prior to visit.     No Known Allergies   Social History   Socioeconomic History  . Marital status: Single    Spouse name: Not on file  . Number of children: Not on file  . Years of education: Not on file  . Highest education level: Not on file  Occupational History  . Not on file  Social Needs  . Financial resource strain: Not on file  . Food insecurity    Worry: Not on file    Inability: Not on file  . Transportation needs    Medical: Not on file    Non-medical: Not on file  Tobacco Use  . Smoking status: Former Research scientist (life sciences)  . Smokeless tobacco: Never Used  Substance and Sexual Activity  . Alcohol use: Yes    Alcohol/week: 2.0 standard drinks    Types: 2 Glasses of wine per week  . Drug use: No  . Sexual activity: Not on file  Lifestyle  . Physical activity    Days per week: Not on file    Minutes per session: Not on file  . Stress: Not on file  Relationships  . Social Herbalist on phone: Not on file    Gets together: Not on file    Attends religious service: Not on  file    Active member of club or organization: Not on file    Attends meetings of clubs or organizations: Not on file    Relationship status: Not on file  Other Topics Concern  . Not on file  Social History Narrative   hh of 2    Pet dog   Working [employer] 8 - 5    Cleaning  5 days a week.    No tobacco   caffiene in am    ETOh.   On feet a lot     Family History  Problem Relation Age of Onset  . Hypertension Mother   . Diabetes Father   . Dementia Sister        in 25s now in nursingg home no dx    Past Surgical History:  Procedure Laterality Date  . CESAREAN SECTION    . PARTIAL HYSTERECTOMY     for bledding 1994     Objective:  General: Alert and oriented x3 in no acute distress  Dermatology: Scar contracture at right 2nd toe, otherwise areas are well healed from previous surgery on right foot. No open lesions bilateral lower extremities, no webspace macerations, no ecchymosis bilateral, all nails x 10 are well manicured.      Vascular: Dorsalis  Pedis and Posterior Tibial pedal pulses 2/4, Capillary Fill Time 3 seconds, (+) pedal hair growth bilateral, no edema bilateral lower extremities, Temperature gradient within normal limits.  Neurology: Michaell CowingGross sensation intact via light touch bilateral.  Musculoskeletal: Mild tenderness with palpation right 1st toe with dorsal elevation noted of the 2nd toe and lesser 3-5 hammertoe. + Pes planus.   Assessment and Plan: Problem List Items Addressed This Visit    None    Visit Diagnoses    Hammer toe of right foot    -  Primary   Acquired hallux valgus of right foot       Metatarsalgia, right foot       Pain in right foot          -Complete examination performed -Xrays reviewed again this visit -Reviewed notes from Dr. Laverta BaltimoreHewitt's office -Patient opt for surgical management. Consent obtained for R 2nd capsulotomy, release of scar, tendon release/augmentation, 3-5 hammertoe repair with kwire, 3rd metatarsal  osteotomy. Pre and Post op course explained. Risks, benefits, alternatives explained. No guarantees given or implied. Surgical booking slip submitted and provided patient with Surgical packet and info for [facility]; patient will call Delydia for a date of surgery . -To dispense wedge post op shoe to bring with her on day of surgery  -Recommend continue with good supportive shoes and inserts meanwhile -Patient to return to office are surgery or sooner if condition worsens.  Asencion Islamitorya Naidelin Gugliotta, DPM

## 2019-09-03 ENCOUNTER — Telehealth: Payer: Self-pay | Admitting: *Deleted

## 2019-09-03 ENCOUNTER — Telehealth: Payer: Self-pay | Admitting: Sports Medicine

## 2019-09-03 NOTE — Telephone Encounter (Signed)
"  I am a patient of Dr. Cannon Kettle.  I'm calling to set up an appointment for surgery for next month.  I am looking at possibly the 19th or 26th of October."

## 2019-09-03 NOTE — Telephone Encounter (Signed)
Pt is calling to schedule her surgery with Dr. Cannon Kettle.

## 2019-09-05 NOTE — Telephone Encounter (Signed)
"  I got your message.  I'd like to go ahead and schedule it for October 26, that way, I'll be able to take care of some things first.  What do I need to do about FMLA?  I've never had to do this before."  You need to go to your Charity fundraiser and get the forms.  You can fax them or bring them by the office.  Helayne Seminole is the coordinator that does all FMLA.  "Do you have a direct number for her?"  He phone number is [phone].  "What time will I need to be at the surgery center and where is it?"  Someone from the surgical center will call you the Friday before your surgery date and will give you your arrival time.  Their address is [address]  It's in the [place] area.  "I live close to there.  It's a shame I can't drive myself.  Is there anything else I need to do. I hate to ask you all these questions."  You need to register online via the surgical center's online portal.  The instructions on how to do that is in the brochure that we gave you.

## 2019-09-05 NOTE — Telephone Encounter (Signed)
I attempted to return her call.  I left her a message that October 19 and 26 is available.  I asked her to call me back and let me know which date she prefers.

## 2019-09-10 ENCOUNTER — Encounter: Payer: BLUE CROSS/BLUE SHIELD | Admitting: Internal Medicine

## 2019-09-11 NOTE — Progress Notes (Signed)
Chief Complaint  Patient presents with  . Annual Exam    Pt has no concerns     HPI: Patient  Sue Golden  58 y.o. comes in today for Preventive Health Care visit   Bp: has been in range  When checks   Health Maintenance  Topic Date Due  . Hepatitis C Screening  06-15-1961  . HIV Screening  09/12/1975  . INFLUENZA VACCINE  03/12/2020 (Originally 07/14/2019)  . COLONOSCOPY  05/08/2020  . MAMMOGRAM  12/04/2020  . TETANUS/TDAP  09/04/2028   Health Maintenance Review LIFESTYLE:  Exercise:  Walking and work  steps.  Tobacco/ETS:no Alcohol:  ocass  Sugar beverages: Sleep:ave 5-6  Drug use: no HH of  2 pet dog.  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 GI /GU: No adominal pain, vomiting, change in bowel habits. No blood in the stool. No significant GU symptoms. SKIN/HEME: ,no acute skin rashes suspicious lesions or bleeding. No lymphadenopathy, nodules, masses.  NEURO/ PSYCH:  No neurologic signs such as weakness numbness. No depression anxiety. IMM/ Allergy: No unusual infections.  Allergy .   REST of 12 system review negative except as per HPI   Past Medical History:  Diagnosis Date  . Anemia   . Chicken pox     Past Surgical History:  Procedure Laterality Date  . CESAREAN SECTION    . PARTIAL HYSTERECTOMY     for bledding 1994    Family History  Problem Relation Age of Onset  . Hypertension Mother   . Diabetes Father   . Dementia Sister        in 75s now in nursingg home no dx    Social History   Socioeconomic History  . Marital status: Single    Spouse name: Not on file  . Number of children: Not on file  . Years of education: Not on file  . Highest education level: Not on file  Occupational History  . Not on file  Social Needs  . Financial  resource strain: Not on file  . Food insecurity    Worry: Not on file    Inability: Not on file  . Transportation needs    Medical: Not on file    Non-medical: Not on file  Tobacco Use  . Smoking status: Former Games developer  . Smokeless tobacco: Never Used  Substance and Sexual Activity  . Alcohol use: Yes    Alcohol/week: 2.0 standard drinks    Types: 2 Glasses of wine per week  . Drug use: No  . Sexual activity: Not on file  Lifestyle  . Physical activity    Days per week: Not on file    Minutes per session: Not on file  . Stress: Not on file  Relationships  . Social Musician on phone: Not on file    Gets together: Not on file    Attends religious service: Not on file    Active member of club or organization: Not on file    Attends meetings of clubs or organizations: Not on file    Relationship status: Not on file  Other Topics Concern  . Not on file  Social History Narrative   hh of 2    Pet dog   Working [employer] 8 -  5    Cleaning  5 days a week.    No tobacco   caffiene in am    ETOh.   On feet a lot     Outpatient Medications Prior to Visit  Medication Sig Dispense Refill  . lisinopril (PRINIVIL,ZESTRIL) 10 MG tablet TAKE 1 TABLET DAILY 90 tablet 4   No facility-administered medications prior to visit.      EXAM:  BP 132/84 (BP Location: Right Arm, Patient Position: Sitting, Cuff Size: Normal)   Pulse 66   Temp 97.6 F (36.4 C) (Temporal)   Ht  (1.626 m)   Wt 178 lb 3.2 oz (80.8 kg)   LMP 12/13/1992 (Approximate)   SpO2 97%   BMI 30.59 kg/m   Body mass index is 30.59 kg/m. Wt Readings from Last 3 Encounters:  09/14/19 178 lb 3.2 oz (80.8 kg)  09/04/18 168 lb 1.6 oz (76.2 kg)  08/30/17 165 lb 9.6 oz (75.1 kg)    Physical Exam: Vital signs reviewed ONG:EXBM is a well-developed well-nourished alert cooperative    who appearsr stated age in no acute distress.  HEENT: normocephalic atraumatic , Eyes: PERRL EOM's full,  conjunctiva clear,glasses ., Ears: no deformity EAC's clear TMs with normal landmarks. Mouth: clear OP, masked  Sees dentistNECK: supple without masses, thyromegaly or bruits. CHEST/PULM:  Clear to auscultation and percussion breath sounds equal no wheeze , rales or rhonchi. No chest wall deformities or tenderness. Breast: normal by inspection . No dimpling, discharge, masses, tenderness or discharge . CV: PMI is nondisplaced, S1 S2 no gallops, murmurs, rubs. Peripheral pulses are full without delay.No JVD .  ABDOMEN: Bowel sounds normal nontender  No guard or rebound, no hepato splenomegal no CVA tenderness.  No hernia. Extremtities:  No clubbing cyanosis or edema, no acute joint swelling or redness no focal atrophy NEURO:  Oriented x3, cranial nerves 3-12 appear to be intact, no obvious focal weakness,gait within normal limits no abnormal reflexes or asymmetrical SKIN: No acute rashes normal turgor, color, no bruising or petechiae.  monevus dark lle no change ( has seen derm and said all ok)  PSYCH: Oriented, good eye contact, no obvious depression anxiety, cognition and judgment appear normal. LN: no cervical axillary inguinal adenopathy  Lab Results  Component Value Date   WBC 3.2 (L) 09/04/2018   HGB 12.3 09/04/2018   HCT 36.7 09/04/2018   PLT 204.0 09/04/2018   GLUCOSE 90 09/04/2018   CHOL 211 (H) 09/04/2018   TRIG 63.0 09/04/2018   HDL 67.40 09/04/2018   LDLDIRECT 131.2 10/24/2012   LDLCALC 131 (H) 09/04/2018   ALT 16 09/04/2018   AST 14 09/04/2018   NA 138 09/04/2018   K 5.1 09/04/2018   CL 106 09/04/2018   CREATININE 0.81 09/04/2018   BUN 21 09/04/2018   CO2 28 09/04/2018   TSH 1.14 09/04/2018    BP Readings from Last 3 Encounters:  09/14/19 132/84  01/23/19 (!) 155/95  09/04/18 132/80    Lab plan eviewed with patient   ASSESSMENT AND PLAN:  Discussed the following assessment and plan:    ICD-10-CM   1. Visit for preventive health examination  Z00.00 Basic  metabolic panel    CBC with Differential/Platelet    Hepatic function panel    Lipid panel    TSH    Hepatitis C antibody  2. Medication management  Z79.899 Basic metabolic panel    CBC with Differential/Platelet    Hepatic function panel    Lipid panel  TSH  3. Essential hypertension  I10 Basic metabolic panel    CBC with Differential/Platelet    Hepatic function panel    Lipid panel    TSH  4. Leukopenia, unspecified type  D72.819 Basic metabolic panel    CBC with Differential/Platelet    Hepatic function panel    Lipid panel    TSH  5. Need for hepatitis C screening test  Z11.59 Hepatitis C antibody  6. Influenza vaccination declined by patient  Z28.21    bp controlled   continue monitor  Counseled. Vit d and calcium in diet .   Encouraged to get inf immuniz   cpx lab in a year.  Patient Care Team: Madelin HeadingsPanosh, Ellizabeth Dacruz K, MD as PCP - General Patient Instructions  Glad you are doing well. Continue lifestyle intervention healthy eating and exercise .   strongly advise influenza immunization as discussed before end of October .  bp goal best 120/80 acceptable below 140/90   Health Maintenance, Female Adopting a healthy lifestyle and getting preventive care are important in promoting health and wellness. Ask your health care provider about:  The right schedule for you to have regular tests and exams.  Things you can do on your own to prevent diseases and keep yourself healthy. What should I know about diet, weight, and exercise? Eat a healthy diet   Eat a diet that includes plenty of vegetables, fruits, low-fat dairy products, and lean protein.  Do not eat a lot of foods that are high in solid fats, added sugars, or sodium. Maintain a healthy weight Body mass index (BMI) is used to identify weight problems. It estimates body fat based on height and weight. Your health care provider can help determine your BMI and help you achieve or maintain a healthy weight. Get  regular exercise Get regular exercise. This is one of the most important things you can do for your health. Most adults should:  Exercise for at least 150 minutes each week. The exercise should increase your heart rate and make you sweat (moderate-intensity exercise).  Do strengthening exercises at least twice a week. This is in addition to the moderate-intensity exercise.  Spend less time sitting. Even light physical activity can be beneficial. Watch cholesterol and blood lipids Have your blood tested for lipids and cholesterol at 59 years of age, then have this test every 5 years. Have your cholesterol levels checked more often if:  Your lipid or cholesterol levels are high.  You are older than 59 years of age.  You are at high risk for heart disease. What should I know about cancer screening? Depending on your health history and family history, you may need to have cancer screening at various ages. This may include screening for:  Breast cancer.  Cervical cancer.  Colorectal cancer.  Skin cancer.  Lung cancer. What should I know about heart disease, diabetes, and high blood pressure? Blood pressure and heart disease  High blood pressure causes heart disease and increases the risk of stroke. This is more likely to develop in people who have high blood pressure readings, are of African descent, or are overweight.  Have your blood pressure checked: ? Every 3-5 years if you are 3118-59 years of age. ? Every year if you are 59 years old or older. Diabetes Have regular diabetes screenings. This checks your fasting blood sugar level. Have the screening done:  Once every three years after age 59 if you are at a normal weight and have a low  risk for diabetes.  More often and at a younger age if you are overweight or have a high risk for diabetes. What should I know about preventing infection? Hepatitis B If you have a higher risk for hepatitis B, you should be screened for this  virus. Talk with your health care provider to find out if you are at risk for hepatitis B infection. Hepatitis C Testing is recommended for:  Everyone born from [birth year] through 1966.  Anyone with known risk factors for hepatitis C. Sexually transmitted infections (STIs)  Get screened for STIs, including gonorrhea and chlamydia, if: ? You are sexually active and are younger than 59 years of age. ? You are older than 59 years of age and your health care provider tells you that you are at risk for this type of infection. ? Your sexual activity has changed since you were last screened, and you are at increased risk for chlamydia or gonorrhea. Ask your health care provider if you are at risk.  Ask your health care provider about whether you are at high risk for HIV. Your health care provider may recommend a prescription medicine to help prevent HIV infection. If you choose to take medicine to prevent HIV, you should first get tested for HIV. You should then be tested every 3 months for as long as you are taking the medicine. Pregnancy  If you are about to stop having your period (premenopausal) and you may become pregnant, seek counseling before you get pregnant.  Take 400 to 800 micrograms (mcg) of folic acid every day if you become pregnant.  Ask for birth control (contraception) if you want to prevent pregnancy. Osteoporosis and menopause Osteoporosis is a disease in which the bones lose minerals and strength with aging. This can result in bone fractures. If you are 5 years old or older, or if you are at risk for osteoporosis and fractures, ask your health care provider if you should:  Be screened for bone loss.  Take a calcium or vitamin D supplement to lower your risk of fractures.  Be given hormone replacement therapy (HRT) to treat symptoms of menopause. Follow these instructions at home: Lifestyle  Do not use any products that contain nicotine or tobacco, such as cigarettes,  e-cigarettes, and chewing tobacco. If you need help quitting, ask your health care provider.  Do not use street drugs.  Do not share needles.  Ask your health care provider for help if you need support or information about quitting drugs. Alcohol use  Do not drink alcohol if: ? Your health care provider tells you not to drink. ? You are pregnant, may be pregnant, or are planning to become pregnant.  If you drink alcohol: ? Limit how much you use to 0-1 drink a day. ? Limit intake if you are breastfeeding.  Be aware of how much alcohol is in your drink. In the [place] one drink equals one 12 oz bottle of beer (355 mL), one 5 oz glass of wine (148 mL), or one 1 oz glass of hard liquor (44 mL). General instructions  Schedule regular health, dental, and eye exams.  Stay current with your vaccines.  Tell your health care provider if: ? You often feel depressed. ? You have ever been abused or do not feel safe at home. Summary  Adopting a healthy lifestyle and getting preventive care are important in promoting health and wellness.  Follow your health care provider's instructions about healthy diet, exercising, and getting tested or screened  for diseases.  Follow your health care provider's instructions on monitoring your cholesterol and blood pressure. This information is not intended to replace advice given to you by your health care provider. Make sure you discuss any questions you have with your health care provider. Document Released: 06/14/2011 Document Revised: 11/22/2018 Document Reviewed: 11/22/2018 Elsevier Patient Education  2020 Froid Willer [place]orno M.D.

## 2019-09-14 ENCOUNTER — Encounter: Payer: Self-pay | Admitting: Internal Medicine

## 2019-09-14 ENCOUNTER — Other Ambulatory Visit: Payer: Self-pay

## 2019-09-14 ENCOUNTER — Telehealth: Payer: Self-pay | Admitting: Sports Medicine

## 2019-09-14 ENCOUNTER — Ambulatory Visit (INDEPENDENT_AMBULATORY_CARE_PROVIDER_SITE_OTHER): Payer: BC Managed Care – PPO | Admitting: Internal Medicine

## 2019-09-14 VITALS — BP 132/84 | HR 66 | Temp 97.6°F | Ht 64.0 in | Wt 178.2 lb

## 2019-09-14 DIAGNOSIS — D72819 Decreased white blood cell count, unspecified: Secondary | ICD-10-CM | POA: Diagnosis not present

## 2019-09-14 DIAGNOSIS — Z2821 Immunization not carried out because of patient refusal: Secondary | ICD-10-CM

## 2019-09-14 DIAGNOSIS — Z79899 Other long term (current) drug therapy: Secondary | ICD-10-CM | POA: Diagnosis not present

## 2019-09-14 DIAGNOSIS — Z0000 Encounter for general adult medical examination without abnormal findings: Secondary | ICD-10-CM

## 2019-09-14 DIAGNOSIS — I10 Essential (primary) hypertension: Secondary | ICD-10-CM | POA: Diagnosis not present

## 2019-09-14 DIAGNOSIS — Z1159 Encounter for screening for other viral diseases: Secondary | ICD-10-CM

## 2019-09-14 LAB — CBC WITH DIFFERENTIAL/PLATELET
Basophils Absolute: 0 10*3/uL (ref 0.0–0.1)
Basophils Relative: 0.3 % (ref 0.0–3.0)
Eosinophils Absolute: 0.1 10*3/uL (ref 0.0–0.7)
Eosinophils Relative: 2.2 % (ref 0.0–5.0)
HCT: 36 % (ref 36.0–46.0)
Hemoglobin: 11.8 g/dL — ABNORMAL LOW (ref 12.0–15.0)
Lymphocytes Relative: 55.8 % — ABNORMAL HIGH (ref 12.0–46.0)
Lymphs Abs: 1.5 10*3/uL (ref 0.7–4.0)
MCHC: 32.9 g/dL (ref 30.0–36.0)
MCV: 91.8 fl (ref 78.0–100.0)
Monocytes Absolute: 0.3 10*3/uL (ref 0.1–1.0)
Monocytes Relative: 12.3 % — ABNORMAL HIGH (ref 3.0–12.0)
Neutro Abs: 0.8 10*3/uL — ABNORMAL LOW (ref 1.4–7.7)
Neutrophils Relative %: 29.4 % — ABNORMAL LOW (ref 43.0–77.0)
Platelets: 193 10*3/uL (ref 150.0–400.0)
RBC: 3.92 Mil/uL (ref 3.87–5.11)
RDW: 13.3 % (ref 11.5–15.5)
WBC: 2.8 10*3/uL — ABNORMAL LOW (ref 4.0–10.5)

## 2019-09-14 LAB — BASIC METABOLIC PANEL
BUN: 15 mg/dL (ref 6–23)
CO2: 27 mEq/L (ref 19–32)
Calcium: 9.5 mg/dL (ref 8.4–10.5)
Chloride: 108 mEq/L (ref 96–112)
Creatinine, Ser: 0.85 mg/dL (ref 0.40–1.20)
GFR: 82.83 mL/min (ref >60.00)
Glucose, Bld: 99 mg/dL (ref 70–99)
Potassium: 4.7 mEq/L (ref 3.5–5.1)
Sodium: 140 mEq/L (ref 135–145)

## 2019-09-14 LAB — LIPID PANEL
Cholesterol: 204 mg/dL — ABNORMAL HIGH (ref 0–200)
HDL: 61.7 mg/dL (ref >39.00)
LDL Cholesterol: 131 mg/dL — ABNORMAL HIGH (ref 0–99)
NonHDL: 142.53
Total CHOL/HDL Ratio: 3
Triglycerides: 58 mg/dL (ref 0.0–149.0)
VLDL: 11.6 mg/dL (ref 0.0–40.0)

## 2019-09-14 LAB — HEPATIC FUNCTION PANEL
ALT: 13 U/L (ref 0–35)
AST: 12 U/L (ref 0–37)
Albumin: 4 g/dL (ref 3.5–5.2)
Alkaline Phosphatase: 64 U/L (ref 39–117)
Bilirubin, Direct: 0 mg/dL (ref 0.0–0.3)
Total Bilirubin: 0.5 mg/dL (ref 0.2–1.2)
Total Protein: 7.1 g/dL (ref 6.0–8.3)

## 2019-09-14 LAB — TSH: TSH: 0.99 u[IU]/mL (ref 0.35–4.50)

## 2019-09-14 NOTE — Telephone Encounter (Signed)
DOS: 10/08/2019 SURGICAL PROCEDURES: Metatarsal Osteotomy 3rd Rt, Repair Tendon 2nd Rt, Capsulotomy MPJ Release Joint 2nd Rt, Hammertoe Repair 3-5 Rt. CPT CODES: 36144, 28086, 28270, 28285 (X3) DX CODES: M77.9, M67.00, M20.41, M21.549  Member Number: [id number]  Policy Effective : 50/93/2671  -  12/12/9998   Name: Sue Golden   Date of Birth:October 25, 1961  Member Liability Summary       In-Network   Max Per Benefit Period Year-to-Date Remaining     CoInsurance         Deductible $1,600.00 $1,114.58     Out-Of-Pocket 3 $4,500.00 $4,014.58      Out-of-Network   Max Per Benefit Period Year-to-Date Remaining     CoInsurance         Deductible $3,800.00 $3,800.00     Out-Of-Pocket 3 $9,000.00 $9,000.00   [facility]      In-Network Copay Coinsurance Authorization Required Not Applicable 24%  No Messages: FACILITY BENEFIT      Out of Network Copay Coinsurance Authorization Required Not Applicable 58%  No Messages: FACILITY BENEFIT

## 2019-09-14 NOTE — Patient Instructions (Addendum)
Glad you are doing well. Continue lifestyle intervention healthy eating and exercise .   strongly advise influenza immunization as discussed before end of October .  bp goal best 120/80 acceptable below 140/90   Health Maintenance, Female Adopting a healthy lifestyle and getting preventive care are important in promoting health and wellness. Ask your health care provider about:  The right schedule for you to have regular tests and exams.  Things you can do on your own to prevent diseases and keep yourself healthy. What should I know about diet, weight, and exercise? Eat a healthy diet   Eat a diet that includes plenty of vegetables, fruits, low-fat dairy products, and lean protein.  Do not eat a lot of foods that are high in solid fats, added sugars, or sodium. Maintain a healthy weight Body mass index (BMI) is used to identify weight problems. It estimates body fat based on height and weight. Your health care provider can help determine your BMI and help you achieve or maintain a healthy weight. Get regular exercise Get regular exercise. This is one of the most important things you can do for your health. Most adults should:  Exercise for at least 150 minutes each week. The exercise should increase your heart rate and make you sweat (moderate-intensity exercise).  Do strengthening exercises at least twice a week. This is in addition to the moderate-intensity exercise.  Spend less time sitting. Even light physical activity can be beneficial. Watch cholesterol and blood lipids Have your blood tested for lipids and cholesterol at 59 years of age, then have this test every 5 years. Have your cholesterol levels checked more often if:  Your lipid or cholesterol levels are high.  You are older than 59 years of age.  You are at high risk for heart disease. What should I know about cancer screening? Depending on your health history and family history, you may need to have cancer  screening at various ages. This may include screening for:  Breast cancer.  Cervical cancer.  Colorectal cancer.  Skin cancer.  Lung cancer. What should I know about heart disease, diabetes, and high blood pressure? Blood pressure and heart disease  High blood pressure causes heart disease and increases the risk of stroke. This is more likely to develop in people who have high blood pressure readings, are of African descent, or are overweight.  Have your blood pressure checked: ? Every 3-5 years if you are 24-58 years of age. ? Every year if you are 54 years old or older. Diabetes Have regular diabetes screenings. This checks your fasting blood sugar level. Have the screening done:  Once every three years after age 63 if you are at a normal weight and have a low risk for diabetes.  More often and at a younger age if you are overweight or have a high risk for diabetes. What should I know about preventing infection? Hepatitis B If you have a higher risk for hepatitis B, you should be screened for this virus. Talk with your health care provider to find out if you are at risk for hepatitis B infection. Hepatitis C Testing is recommended for:  Everyone born from [birth year] through 1966.  Anyone with known risk factors for hepatitis C. Sexually transmitted infections (STIs)  Get screened for STIs, including gonorrhea and chlamydia, if: ? You are sexually active and are younger than 59 years of age. ? You are older than 59 years of age and your health care provider tells you that  you are at risk for this type of infection. ? Your sexual activity has changed since you were last screened, and you are at increased risk for chlamydia or gonorrhea. Ask your health care provider if you are at risk.  Ask your health care provider about whether you are at high risk for HIV. Your health care provider may recommend a prescription medicine to help prevent HIV infection. If you choose to take  medicine to prevent HIV, you should first get tested for HIV. You should then be tested every 3 months for as long as you are taking the medicine. Pregnancy  If you are about to stop having your period (premenopausal) and you may become pregnant, seek counseling before you get pregnant.  Take 400 to 800 micrograms (mcg) of folic acid every day if you become pregnant.  Ask for birth control (contraception) if you want to prevent pregnancy. Osteoporosis and menopause Osteoporosis is a disease in which the bones lose minerals and strength with aging. This can result in bone fractures. If you are 79 years old or older, or if you are at risk for osteoporosis and fractures, ask your health care provider if you should:  Be screened for bone loss.  Take a calcium or vitamin D supplement to lower your risk of fractures.  Be given hormone replacement therapy (HRT) to treat symptoms of menopause. Follow these instructions at home: Lifestyle  Do not use any products that contain nicotine or tobacco, such as cigarettes, e-cigarettes, and chewing tobacco. If you need help quitting, ask your health care provider.  Do not use street drugs.  Do not share needles.  Ask your health care provider for help if you need support or information about quitting drugs. Alcohol use  Do not drink alcohol if: ? Your health care provider tells you not to drink. ? You are pregnant, may be pregnant, or are planning to become pregnant.  If you drink alcohol: ? Limit how much you use to 0-1 drink a day. ? Limit intake if you are breastfeeding.  Be aware of how much alcohol is in your drink. In the [place] one drink equals one 12 oz bottle of beer (355 mL), one 5 oz glass of wine (148 mL), or one 1 oz glass of hard liquor (44 mL). General instructions  Schedule regular health, dental, and eye exams.  Stay current with your vaccines.  Tell your health care provider if: ? You often feel depressed. ? You have  ever been abused or do not feel safe at home. Summary  Adopting a healthy lifestyle and getting preventive care are important in promoting health and wellness.  Follow your health care provider's instructions about healthy diet, exercising, and getting tested or screened for diseases.  Follow your health care provider's instructions on monitoring your cholesterol and blood pressure. This information is not intended to replace advice given to you by your health care provider. Make sure you discuss any questions you have with your health care provider. Document Released: 06/14/2011 Document Revised: 11/22/2018 Document Reviewed: 11/22/2018 Elsevier Patient Education  2020 [employer].

## 2019-09-17 LAB — HEPATITIS C ANTIBODY
Hepatitis C Ab: NONREACTIVE
SIGNAL TO CUT-OFF: 0.01 (ref <1.00)

## 2019-09-19 ENCOUNTER — Telehealth: Payer: Self-pay | Admitting: Internal Medicine

## 2019-09-19 ENCOUNTER — Other Ambulatory Visit: Payer: Self-pay

## 2019-09-19 DIAGNOSIS — D72819 Decreased white blood cell count, unspecified: Secondary | ICD-10-CM

## 2019-09-19 NOTE — Telephone Encounter (Signed)
Reviewed lab results and physician's note with patient. She will call back to schedule repeat CBC with Differential/Platelets.Answered all questions at this time.

## 2019-09-19 NOTE — Telephone Encounter (Signed)
Patient is scheduled for 10/08/2019 for her surgery.

## 2019-10-07 ENCOUNTER — Other Ambulatory Visit: Payer: Self-pay | Admitting: Sports Medicine

## 2019-10-07 DIAGNOSIS — Z9889 Other specified postprocedural states: Secondary | ICD-10-CM

## 2019-10-07 NOTE — Progress Notes (Signed)
Post op meds entered -Dr. S 

## 2019-10-08 ENCOUNTER — Encounter: Payer: Self-pay | Admitting: Sports Medicine

## 2019-10-08 DIAGNOSIS — M7741 Metatarsalgia, right foot: Secondary | ICD-10-CM

## 2019-10-08 DIAGNOSIS — M21541 Acquired clubfoot, right foot: Secondary | ICD-10-CM | POA: Diagnosis not present

## 2019-10-08 DIAGNOSIS — M2041 Other hammer toe(s) (acquired), right foot: Secondary | ICD-10-CM | POA: Diagnosis not present

## 2019-10-08 MED ORDER — PROMETHAZINE HCL 25 MG PO TABS: 25.0000 mg | ORAL_TABLET | Freq: Three times a day (TID) | ORAL | 0 refills | Status: DC | PRN

## 2019-10-08 MED ORDER — IBUPROFEN 800 MG PO TABS: 800.0000 mg | ORAL_TABLET | Freq: Three times a day (TID) | ORAL | 0 refills | Status: DC | PRN

## 2019-10-08 MED ORDER — HYDROCODONE-ACETAMINOPHEN 10-325 MG PO TABS: 1.0000 | ORAL_TABLET | Freq: Four times a day (QID) | ORAL | 0 refills | Status: AC | PRN

## 2019-10-08 MED ORDER — DOCUSATE SODIUM 100 MG PO CAPS: 100.0000 mg | ORAL_CAPSULE | Freq: Two times a day (BID) | ORAL | 0 refills | Status: DC

## 2019-10-09 ENCOUNTER — Telehealth: Payer: Self-pay | Admitting: Sports Medicine

## 2019-10-09 NOTE — Telephone Encounter (Signed)
Post op phone call made to patient. Patient reports that she is feeling ok. Reports that last night her leg felt warm but no longer feels that way now. Reports that her block started to wear off at 5am this morning and she took a Hydrocodone and is feeling ok right now. Patient denies nausea, vomiting, fever, or chills. Reports that she slept in the "boot". I advised her that she can take the wedge shoe off when sitting or lying down but must put it on when she is attempting to walk/stand/go to bathroom. Patient expressed understanding and thanked me for calling. I instructed patient that we will see her next week at her POV1. -Dr. Cannon Kettle

## 2019-10-16 ENCOUNTER — Other Ambulatory Visit: Payer: Self-pay | Admitting: Sports Medicine

## 2019-10-16 ENCOUNTER — Ambulatory Visit (INDEPENDENT_AMBULATORY_CARE_PROVIDER_SITE_OTHER): Payer: BC Managed Care – PPO

## 2019-10-16 ENCOUNTER — Encounter: Payer: Self-pay | Admitting: Sports Medicine

## 2019-10-16 ENCOUNTER — Other Ambulatory Visit: Payer: Self-pay

## 2019-10-16 ENCOUNTER — Other Ambulatory Visit: Payer: BC Managed Care – PPO

## 2019-10-16 ENCOUNTER — Ambulatory Visit (INDEPENDENT_AMBULATORY_CARE_PROVIDER_SITE_OTHER): Payer: Self-pay | Admitting: Sports Medicine

## 2019-10-16 VITALS — BP 142/86 | HR 74 | Temp 96.5°F | Resp 16

## 2019-10-16 DIAGNOSIS — M79671 Pain in right foot: Secondary | ICD-10-CM

## 2019-10-16 DIAGNOSIS — M2041 Other hammer toe(s) (acquired), right foot: Secondary | ICD-10-CM

## 2019-10-16 DIAGNOSIS — M7741 Metatarsalgia, right foot: Secondary | ICD-10-CM

## 2019-10-16 DIAGNOSIS — Z9889 Other specified postprocedural states: Secondary | ICD-10-CM

## 2019-10-16 DIAGNOSIS — M2011 Hallux valgus (acquired), right foot: Secondary | ICD-10-CM

## 2019-10-16 MED ORDER — IBUPROFEN 800 MG PO TABS: 800.0000 mg | ORAL_TABLET | Freq: Three times a day (TID) | ORAL | 0 refills | Status: DC | PRN

## 2019-10-16 MED ORDER — DOCUSATE SODIUM 100 MG PO CAPS: 100.0000 mg | ORAL_CAPSULE | Freq: Two times a day (BID) | ORAL | 0 refills | Status: DC

## 2019-10-16 NOTE — Progress Notes (Signed)
  Subjective: Sue Golden is a 58 y.o. female patient seen today in office for POV #1 (DOS 10/08/2019), S/P right Akin, right plantar plate repair with flexor tendon transfer at the second metatarsophalangeal joint, third through fifth hammertoe repair with shortening osteotomy. Patient denies pain at surgical site and reports that she has been doing very well only taking Motrin as needed, denies calf pain, denies headache, chest pain, shortness of breath, nausea, vomiting, fever, or chills.  No other issues noted.  Patient Active Problem List   Diagnosis Date Noted  . H/O hysterectomy for benign disease 11/02/2012  . Capsulitis of ankle or foot 07/14/2011  . FREQUENCY, URINARY 07/01/2010  . Leukocytopenia 03/20/2010  . IRON DEFICIENCY 02/20/2010  . HEADACHE 02/10/2010  . URINALYSIS, ABNORMAL 02/10/2010  . OSTEOPOROSIS 04/19/2008    Current Outpatient Medications on File Prior to Visit  Medication Sig Dispense Refill  . lisinopril (PRINIVIL,ZESTRIL) 10 MG tablet TAKE 1 TABLET DAILY 90 tablet 4  . promethazine (PHENERGAN) 25 MG tablet Take 1 tablet (25 mg total) by mouth every 8 (eight) hours as needed for nausea or vomiting. 20 tablet 0   No current facility-administered medications on file prior to visit.     No Known Allergies  Objective: There were no vitals filed for this visit.  General: No acute distress, AAOx3  Right foot foot: Sutures and K wires intact with no gapping or dehiscence at surgical site, mild swelling to forefoot on right, no erythema, no warmth, no drainage, no signs of infection noted, Capillary fill time <3 seconds in all digits, gross sensation present via light touch to right foot. No pain or crepitation with range of motion right foot.  No pain with calf compression.   Postop x-rays right foot: Excellent alignment and position. Osteotomy site healing. Hardware intact. Soft tissue swelling within normal limits for post op status.   Assessment and Plan:   Problem List Items Addressed This Visit    None    Visit Diagnoses    Hammer toe of right foot    -  Primary   S/P foot surgery, right       Relevant Medications   docusate sodium (COLACE) 100 MG capsule   ibuprofen (ADVIL) 800 MG tablet   Acquired hallux valgus of right foot       Metatarsalgia, right foot       Pain in right foot           -Patient seen and evaluated -x-rays reviewed -Applied dry sterile dressing to surgical site right foot secured with ACE wrap and stockinet  -Advised patient to make sure to keep dressings clean, dry, and intact to right surgical site, removing the ACE as needed  -Advised patient to continue with postop wedge shoe and walker -Advised patient to limit activity to necessity  -Advised patient to ice and elevate as necessary  -Refilled ibuprofen and Colace to take as needed -Will plan for possible suture removal at next office visit. In the meantime, patient to call office if any issues or problems arise.   Landis Martins, DPM

## 2019-10-23 ENCOUNTER — Other Ambulatory Visit: Payer: Self-pay

## 2019-10-23 ENCOUNTER — Ambulatory Visit (INDEPENDENT_AMBULATORY_CARE_PROVIDER_SITE_OTHER): Payer: Self-pay | Admitting: Sports Medicine

## 2019-10-23 ENCOUNTER — Encounter: Payer: Self-pay | Admitting: Sports Medicine

## 2019-10-23 DIAGNOSIS — M2041 Other hammer toe(s) (acquired), right foot: Secondary | ICD-10-CM

## 2019-10-23 DIAGNOSIS — M2011 Hallux valgus (acquired), right foot: Secondary | ICD-10-CM

## 2019-10-23 DIAGNOSIS — Z9889 Other specified postprocedural states: Secondary | ICD-10-CM

## 2019-10-23 DIAGNOSIS — M7741 Metatarsalgia, right foot: Secondary | ICD-10-CM

## 2019-10-23 NOTE — Progress Notes (Signed)
  Subjective: Sue Golden is a 58 y.o. female patient seen today in office for POV #2 (DOS 10/08/2019), S/P right Akin, right plantar plate repair with flexor tendon transfer at the second metatarsophalangeal joint, third through fifth hammertoe repair with shortening osteotomy. Patient denies pain at surgical site and still taking Motrin as needed, denies calf pain, denies headache, chest pain, shortness of breath, nausea, vomiting, fever, or chills.  No other issues noted.  Patient Active Problem List   Diagnosis Date Noted  . H/O hysterectomy for benign disease 11/02/2012  . Capsulitis of ankle or foot 07/14/2011  . FREQUENCY, URINARY 07/01/2010  . Leukocytopenia 03/20/2010  . IRON DEFICIENCY 02/20/2010  . HEADACHE 02/10/2010  . URINALYSIS, ABNORMAL 02/10/2010  . OSTEOPOROSIS 04/19/2008    Current Outpatient Medications on File Prior to Visit  Medication Sig Dispense Refill  . docusate sodium (COLACE) 100 MG capsule Take 1 capsule (100 mg total) by mouth 2 (two) times daily. 10 capsule 0  . ibuprofen (ADVIL) 800 MG tablet Take 1 tablet (800 mg total) by mouth every 8 (eight) hours as needed. 30 tablet 0  . lisinopril (PRINIVIL,ZESTRIL) 10 MG tablet TAKE 1 TABLET DAILY 90 tablet 4  . promethazine (PHENERGAN) 25 MG tablet Take 1 tablet (25 mg total) by mouth every 8 (eight) hours as needed for nausea or vomiting. 20 tablet 0   No current facility-administered medications on file prior to visit.     No Known Allergies  Objective: There were no vitals filed for this visit.  General: No acute distress, AAOx3  Right foot foot: Sutures and K wires intact with no gapping or dehiscence at surgical site, mild swelling to forefoot on right, no erythema, no warmth, no drainage, no signs of infection noted, Capillary fill time <3 seconds in all digits, gross sensation present via light touch to right foot. No pain or crepitation with range of motion right foot.  No pain with calf  compression.   Assessment and Plan:  Problem List Items Addressed This Visit    None    Visit Diagnoses    S/P foot surgery, right    -  Primary   Hammer toe of right foot       Metatarsalgia, right foot       Acquired hallux interphalangeus of right foot           -Patient seen and evaluated -Every other suture removed -Applied dry sterile dressing to surgical site right foot secured with ACE wrap and stockinet  -Advised patient to make sure to keep dressings clean, dry, and intact to right surgical site, removing the ACE as needed  -Advised patient to continue with postop wedge shoe and walker -Advised patient to limit activity to necessity  -Advised patient to ice and elevate as necessary  -Refilled ibuprofen and Colace to take as needed -Will plan for finisihing suture removal at next office visit. In the meantime, patient to call office if any issues or problems arise.   Landis Martins, DPM

## 2019-10-30 ENCOUNTER — Other Ambulatory Visit: Payer: Self-pay

## 2019-10-30 ENCOUNTER — Encounter: Payer: Self-pay | Admitting: Sports Medicine

## 2019-10-30 ENCOUNTER — Ambulatory Visit (INDEPENDENT_AMBULATORY_CARE_PROVIDER_SITE_OTHER): Payer: Self-pay | Admitting: Sports Medicine

## 2019-10-30 DIAGNOSIS — M2041 Other hammer toe(s) (acquired), right foot: Secondary | ICD-10-CM

## 2019-10-30 DIAGNOSIS — M7741 Metatarsalgia, right foot: Secondary | ICD-10-CM

## 2019-10-30 DIAGNOSIS — Z9889 Other specified postprocedural states: Secondary | ICD-10-CM

## 2019-10-30 DIAGNOSIS — M2011 Hallux valgus (acquired), right foot: Secondary | ICD-10-CM

## 2019-10-30 DIAGNOSIS — M79671 Pain in right foot: Secondary | ICD-10-CM

## 2019-10-30 NOTE — Progress Notes (Signed)
  Subjective: Sue Golden is a 58 y.o. female patient seen today in office for POV #2 (DOS 10/08/2019), S/P right Akin, right plantar plate repair with flexor tendon transfer at the second metatarsophalangeal joint, third through fifth hammertoe repair with shortening osteotomy. Patient denies pain at surgical site and reports that she has been complaint with staying off the foot and taking Motrin as needed, denies calf pain, denies headache, chest pain, shortness of breath, nausea, vomiting, fever, or chills.  No other issues noted.   Patient Active Problem List   Diagnosis Date Noted  . H/O hysterectomy for benign disease 11/02/2012  . Capsulitis of ankle or foot 07/14/2011  . FREQUENCY, URINARY 07/01/2010  . Leukocytopenia 03/20/2010  . IRON DEFICIENCY 02/20/2010  . HEADACHE 02/10/2010  . URINALYSIS, ABNORMAL 02/10/2010  . OSTEOPOROSIS 04/19/2008    Current Outpatient Medications on File Prior to Visit  Medication Sig Dispense Refill  . docusate sodium (COLACE) 100 MG capsule Take 1 capsule (100 mg total) by mouth 2 (two) times daily. 10 capsule 0  . ibuprofen (ADVIL) 800 MG tablet Take 1 tablet (800 mg total) by mouth every 8 (eight) hours as needed. 30 tablet 0  . lisinopril (PRINIVIL,ZESTRIL) 10 MG tablet TAKE 1 TABLET DAILY 90 tablet 4  . promethazine (PHENERGAN) 25 MG tablet Take 1 tablet (25 mg total) by mouth every 8 (eight) hours as needed for nausea or vomiting. 20 tablet 0   No current facility-administered medications on file prior to visit.     No Known Allergies  Objective: There were no vitals filed for this visit.  General: No acute distress, AAOx3  Right foot foot: Sutures and K wires intact with no gapping or dehiscence at surgical site, mild swelling to forefoot on right, no erythema, no warmth, no drainage, no signs of infection noted, Capillary fill time <3 seconds in all digits, gross sensation present via light touch to right foot. No pain or crepitation  with range of motion right foot.  No pain with calf compression.    Assessment and Plan:  Problem List Items Addressed This Visit    None    Visit Diagnoses    S/P foot surgery, right    -  Primary   Hammer toe of right foot       Metatarsalgia, right foot       Acquired hallux interphalangeus of right foot       Acquired hallux valgus of right foot       Pain in right foot         -Patient seen and evaluated -Sutures removed and k-wire at 4th toe removed since it was backing out  -Applied dry sterile dressing to surgical site right foot secured with ACE wrap and stockinet  -Advised patient to make sure to keep dressings clean, dry, and intact to right surgical site, removing the ACE as needed  -Advised patient to continue with postop wedge shoe and walker -Advised patient to limit activity to necessity  -Advised patient to ice and elevate as necessary  -Continue with ibuprofen and Colace to take as needed -Will plan for xray and removal of 3rd toe k-wire at next office visit. In the meantime, patient to call office if any issues or problems arise.   Landis Martins, DPM

## 2019-11-06 ENCOUNTER — Other Ambulatory Visit: Payer: Self-pay

## 2019-11-06 ENCOUNTER — Ambulatory Visit (INDEPENDENT_AMBULATORY_CARE_PROVIDER_SITE_OTHER): Payer: BC Managed Care – PPO

## 2019-11-06 ENCOUNTER — Encounter: Payer: Self-pay | Admitting: Sports Medicine

## 2019-11-06 ENCOUNTER — Ambulatory Visit (INDEPENDENT_AMBULATORY_CARE_PROVIDER_SITE_OTHER): Payer: BC Managed Care – PPO | Admitting: Sports Medicine

## 2019-11-06 DIAGNOSIS — M2041 Other hammer toe(s) (acquired), right foot: Secondary | ICD-10-CM

## 2019-11-06 DIAGNOSIS — M2011 Hallux valgus (acquired), right foot: Secondary | ICD-10-CM

## 2019-11-06 DIAGNOSIS — M7741 Metatarsalgia, right foot: Secondary | ICD-10-CM

## 2019-11-06 DIAGNOSIS — M79671 Pain in right foot: Secondary | ICD-10-CM

## 2019-11-06 DIAGNOSIS — Z9889 Other specified postprocedural states: Secondary | ICD-10-CM

## 2019-11-06 NOTE — Progress Notes (Signed)
  Subjective: Sue Golden is a 58 y.o. female patient seen today in office for POV #3 (DOS 10/08/2019), S/P right Akin, right plantar plate repair with flexor tendon transfer at the second metatarsophalangeal joint, third through fifth hammertoe repair with shortening osteotomy. Patient reports that she is doing well. Still taking Motrin as needed, denies calf pain, denies headache, chest pain, shortness of breath, nausea, vomiting, fever, or chills.  No other issues noted.   Patient Active Problem List   Diagnosis Date Noted  . H/O hysterectomy for benign disease 11/02/2012  . Capsulitis of ankle or foot 07/14/2011  . FREQUENCY, URINARY 07/01/2010  . Leukocytopenia 03/20/2010  . IRON DEFICIENCY 02/20/2010  . HEADACHE 02/10/2010  . URINALYSIS, ABNORMAL 02/10/2010  . OSTEOPOROSIS 04/19/2008    Current Outpatient Medications on File Prior to Visit  Medication Sig Dispense Refill  . docusate sodium (COLACE) 100 MG capsule Take 1 capsule (100 mg total) by mouth 2 (two) times daily. 10 capsule 0  . ibuprofen (ADVIL) 800 MG tablet Take 1 tablet (800 mg total) by mouth every 8 (eight) hours as needed. 30 tablet 0  . lisinopril (PRINIVIL,ZESTRIL) 10 MG tablet TAKE 1 TABLET DAILY 90 tablet 4  . promethazine (PHENERGAN) 25 MG tablet Take 1 tablet (25 mg total) by mouth every 8 (eight) hours as needed for nausea or vomiting. 20 tablet 0   No current facility-administered medications on file prior to visit.     No Known Allergies  Objective: There were no vitals filed for this visit.  General: No acute distress, AAOx3  Right foot foot: K wire intact with no gapping or dehiscence at surgical site, mild swelling to forefoot on right, no erythema, no warmth, no drainage, no signs of infection noted, Capillary fill time <3 seconds in all digits, gross sensation present via light touch to right foot. No pain or crepitation with range of motion right foot.  No pain with calf compression.     Assessment and Plan:  Problem List Items Addressed This Visit    None    Visit Diagnoses    Hammer toe of right foot    -  Primary   Relevant Orders   DG Foot Complete Right   Metatarsalgia, right foot       Relevant Orders   DG Foot Complete Right   S/P foot surgery, right       Acquired hallux interphalangeus of right foot       Acquired hallux valgus of right foot       Pain in right foot         -Patient seen and evaluated -Kwires removed -Applied dry sterile dressing to surgical site right foot secured with ACE wrap and stockinet  -Advised patient to shower tomorrow  -Advised patient to continue with postop flat shoe now as dispensed  -Advised patient to limit activity to necessity  -Advised patient to ice and elevate as necessary  -Continue with ibuprofen and Colace to take as needed -Will plan for post op check. In the meantime, patient to call office if any issues or problems arise.   Landis Martins, DPM

## 2019-11-20 ENCOUNTER — Other Ambulatory Visit: Payer: Self-pay

## 2019-11-20 ENCOUNTER — Ambulatory Visit (INDEPENDENT_AMBULATORY_CARE_PROVIDER_SITE_OTHER): Payer: BC Managed Care – PPO

## 2019-11-20 ENCOUNTER — Encounter: Payer: Self-pay | Admitting: Sports Medicine

## 2019-11-20 ENCOUNTER — Ambulatory Visit (INDEPENDENT_AMBULATORY_CARE_PROVIDER_SITE_OTHER): Payer: BC Managed Care – PPO | Admitting: Sports Medicine

## 2019-11-20 DIAGNOSIS — M7741 Metatarsalgia, right foot: Secondary | ICD-10-CM

## 2019-11-20 DIAGNOSIS — M2041 Other hammer toe(s) (acquired), right foot: Secondary | ICD-10-CM

## 2019-11-20 DIAGNOSIS — M79671 Pain in right foot: Secondary | ICD-10-CM

## 2019-11-20 DIAGNOSIS — M2011 Hallux valgus (acquired), right foot: Secondary | ICD-10-CM

## 2019-11-20 DIAGNOSIS — Z9889 Other specified postprocedural states: Secondary | ICD-10-CM

## 2019-11-20 NOTE — Progress Notes (Signed)
  Subjective: Sue Golden is a 58 y.o. female patient seen today in office for POV #4 (DOS 10/08/2019), S/P right Akin, right plantar plate repair with flexor tendon transfer at the second metatarsophalangeal joint, third through fifth hammertoe repair with shortening osteotomy. Patient reports that she is doing well. NO PAIN OR ISSUES, denies calf pain, denies headache, chest pain, shortness of breath, nausea, vomiting, fever, or chills.  No other issues noted.  Patient Active Problem List   Diagnosis Date Noted  . H/O hysterectomy for benign disease 11/02/2012  . Capsulitis of ankle or foot 07/14/2011  . FREQUENCY, URINARY 07/01/2010  . Leukocytopenia 03/20/2010  . IRON DEFICIENCY 02/20/2010  . HEADACHE 02/10/2010  . URINALYSIS, ABNORMAL 02/10/2010  . OSTEOPOROSIS 04/19/2008    Current Outpatient Medications on File Prior to Visit  Medication Sig Dispense Refill  . docusate sodium (COLACE) 100 MG capsule Take 1 capsule (100 mg total) by mouth 2 (two) times daily. 10 capsule 0  . ibuprofen (ADVIL) 800 MG tablet Take 1 tablet (800 mg total) by mouth every 8 (eight) hours as needed. 30 tablet 0  . lisinopril (PRINIVIL,ZESTRIL) 10 MG tablet TAKE 1 TABLET DAILY 90 tablet 4  . promethazine (PHENERGAN) 25 MG tablet Take 1 tablet (25 mg total) by mouth every 8 (eight) hours as needed for nausea or vomiting. 20 tablet 0   No current facility-administered medications on file prior to visit.     No Known Allergies  Objective: There were no vitals filed for this visit.  General: No acute distress, AAOx3  Right foot foot: Incisions well healed, mild swelling to forefoot on right, no erythema, no warmth, no drainage, no signs of infection noted, Capillary fill time <3 seconds in all digits, gross sensation present via light touch to right foot. No pain or crepitation with range of motion right foot. Range of motion acceptable for post op status.  No pain with calf compression.    Assessment  and Plan:  Problem List Items Addressed This Visit    None    Visit Diagnoses    Hammer toe of right foot    -  Primary   Relevant Orders   DG Foot Complete Right   Metatarsalgia, right foot       S/P foot surgery, right       Acquired hallux interphalangeus of right foot       Acquired hallux valgus of right foot       Pain in right foot         -Patient seen and evaluated -Xrays reviewed consistent with post op status -May slowly transition out of post op shoe to normal shoe to tolerance -Advised patient to ice and elevate as necessary  -Continue with ibuprofen as needed -Encouraged gentle range of motion exercises and scar creams PRN -Will plan for post op check. In the meantime, patient to call office if any issues or problems arise.   Landis Martins, DPM

## 2019-12-17 LAB — HM MAMMOGRAPHY

## 2019-12-18 ENCOUNTER — Other Ambulatory Visit: Payer: Self-pay

## 2019-12-18 ENCOUNTER — Encounter: Payer: Self-pay | Admitting: Sports Medicine

## 2019-12-18 ENCOUNTER — Ambulatory Visit (INDEPENDENT_AMBULATORY_CARE_PROVIDER_SITE_OTHER): Payer: BC Managed Care – PPO | Admitting: Sports Medicine

## 2019-12-18 DIAGNOSIS — Z9889 Other specified postprocedural states: Secondary | ICD-10-CM

## 2019-12-18 DIAGNOSIS — M7741 Metatarsalgia, right foot: Secondary | ICD-10-CM

## 2019-12-18 DIAGNOSIS — M2041 Other hammer toe(s) (acquired), right foot: Secondary | ICD-10-CM

## 2019-12-18 NOTE — Progress Notes (Signed)
  Subjective: Sue Golden is a 59 y.o. female patient seen today in office for POV #5 (DOS 10/08/2019), S/P right Akin, right plantar plate repair with flexor tendon transfer at the second metatarsophalangeal joint, third through fifth hammertoe repair with shortening osteotomy. Patient reports that she is doing good some days with swelling but nothing excessive,  denies calf pain, denies headache, chest pain, shortness of breath, nausea, vomiting, fever, or chills.  No other issues noted.  Patient Active Problem List   Diagnosis Date Noted  . H/O hysterectomy for benign disease 11/02/2012  . Capsulitis of ankle or foot 07/14/2011  . FREQUENCY, URINARY 07/01/2010  . Leukocytopenia 03/20/2010  . IRON DEFICIENCY 02/20/2010  . HEADACHE 02/10/2010  . URINALYSIS, ABNORMAL 02/10/2010  . OSTEOPOROSIS 04/19/2008    Current Outpatient Medications on File Prior to Visit  Medication Sig Dispense Refill  . docusate sodium (COLACE) 100 MG capsule Take 1 capsule (100 mg total) by mouth 2 (two) times daily. 10 capsule 0  . ibuprofen (ADVIL) 800 MG tablet Take 1 tablet (800 mg total) by mouth every 8 (eight) hours as needed. 30 tablet 0  . lisinopril (PRINIVIL,ZESTRIL) 10 MG tablet TAKE 1 TABLET DAILY 90 tablet 4  . promethazine (PHENERGAN) 25 MG tablet Take 1 tablet (25 mg total) by mouth every 8 (eight) hours as needed for nausea or vomiting. 20 tablet 0   No current facility-administered medications on file prior to visit.    No Known Allergies  Objective: There were no vitals filed for this visit.  General: No acute distress, AAOx3  Right foot foot: Incisions well healed, mild swelling to forefoot on right especially at 3rd toe plantar aspect, no erythema, no warmth, no drainage, no signs of infection noted, Capillary fill time <3 seconds in all digits, gross sensation present via light touch to right foot. No pain or crepitation with range of motion right foot. Range of motion acceptable for  post op status.  No pain with calf compression.    Assessment and Plan:  Problem List Items Addressed This Visit    None    Visit Diagnoses    Hammer toe of right foot    -  Primary   Metatarsalgia, right foot       S/P foot surgery, right         -Patient seen and evaluated -Continue with normal shoe to tolerance and may try work shoe on 1-18 -Advised patient to ice and elevate as necessary  -Continue with ibuprofen as needed -Encouraged gentle range of motion exercises and scar creams at previous -May add on Epsom salt soaks PRN  -Continue with no work until [employer] has been completed/end 01-08-20 -Will plan for post op check and XRAYS. In the meantime, patient to call office if any issues or problems arise.   Asencion Islam, DPM

## 2019-12-20 ENCOUNTER — Encounter: Payer: Self-pay | Admitting: Internal Medicine

## 2019-12-28 ENCOUNTER — Encounter: Payer: Self-pay | Admitting: Sports Medicine

## 2019-12-28 ENCOUNTER — Other Ambulatory Visit: Payer: Self-pay

## 2019-12-31 ENCOUNTER — Other Ambulatory Visit: Payer: Self-pay

## 2019-12-31 ENCOUNTER — Ambulatory Visit (INDEPENDENT_AMBULATORY_CARE_PROVIDER_SITE_OTHER): Payer: BC Managed Care – PPO | Admitting: Internal Medicine

## 2019-12-31 ENCOUNTER — Other Ambulatory Visit (HOSPITAL_COMMUNITY)
Admission: RE | Admit: 2019-12-31 | Discharge: 2019-12-31 | Disposition: A | Discharge status: Home / Self Care | Payer: BC Managed Care – PPO | Source: Ambulatory Visit | Attending: Internal Medicine | Admitting: Internal Medicine

## 2019-12-31 ENCOUNTER — Encounter: Payer: Self-pay | Admitting: Internal Medicine

## 2019-12-31 VITALS — BP 130/80 | HR 62 | Temp 98.2°F | Wt 174.6 lb

## 2019-12-31 DIAGNOSIS — N898 Other specified noninflammatory disorders of vagina: Secondary | ICD-10-CM

## 2019-12-31 NOTE — Progress Notes (Signed)
This visit occurred during the SARS-CoV-2 public health emergency.  Safety protocols were in place, including screening questions prior to the visit, additional usage of staff PPE, and extensive cleaning of exam room while observing appropriate contact time as indicated for disinfecting solutions.    Chief Complaint  Patient presents with  . Vaginal Discharge    pt states this has been happening since surgery no itching , burning , or odor just a clear discharge with some white in it at one point but mostly clear     HPI: Sue Golden 59 y.o. come in fornew problem nt  Last check fall for pv etc   3-4 months     Out of work for foot surgery  October .  No antibiotics .   Less than when    made appt .   No  Sx otherwise  Used apple cidar  Vag wash last week and seems to help some  nosx  .   Rinses with vinegar and water.     Had hysterectomy .for childbirth   Bleeding.   Last time  Yeast and other rx  was sti 2011 .  Trich.   No sa for almost a year   ROS: See pertinent positives and negatives per HPI. No abd pain or uti sx   Past Medical History:  Diagnosis Date  . Anemia   . Chicken pox     Family History  Problem Relation Age of Onset  . Hypertension Mother   . Diabetes Father   . Dementia Sister        in 27s now in nursingg home no dx      Outpatient Medications Prior to Visit  Medication Sig Dispense Refill  . lisinopril (PRINIVIL,ZESTRIL) 10 MG tablet TAKE 1 TABLET DAILY 90 tablet 4  . docusate sodium (COLACE) 100 MG capsule Take 1 capsule (100 mg total) by mouth 2 (two) times daily. (Patient not taking: Reported on 12/31/2019) 10 capsule 0  . ibuprofen (ADVIL) 800 MG tablet Take 1 tablet (800 mg total) by mouth every 8 (eight) hours as needed. (Patient not taking: Reported on 12/31/2019) 30 tablet 0  . promethazine (PHENERGAN) 25 MG tablet Take 1 tablet (25 mg total) by mouth every 8 (eight) hours as needed for nausea or vomiting. (Patient not taking: Reported on  12/31/2019) 20 tablet 0   No facility-administered medications prior to visit.     EXAM:  BP 130/80 (BP Location: Right Arm, Patient Position: Sitting, Cuff Size: Normal)   Pulse 62   Temp 98.2 F (36.8 C) (Temporal)   Wt 174 lb 9.6 oz (79.2 kg)   LMP 12/13/1992 (Approximate)   SpO2 99%   BMI 29.97 kg/m   Body mass index is 29.97 kg/m.  GENERAL: vitals reviewed and listed above, alert, oriented, appears well hydrated and in no acute distress HEENT: atraumatic, conjunctiva  clear, no obvious abnormalities on inspection of external nose and ears OP :masked  NECK: no obvious masses on inspection palpation  .Abdomen:  Sof,t normal bowel sounds without hepatosplenomegaly, no guarding rebound or masses no CVA tenderness  Ext gu nl  Vag exam clear to yellow clear watery min dc   Vag mucosa no lesion seen no bleed  Some erythematous  PSYCH: pleasant and cooperative, no obvious depression or anxiety  BP Readings from Last 3 Encounters:  12/31/19 130/80  10/16/19 (!) 142/86  09/14/19 132/84   hyserectomy  ASSESSMENT AND PLAN:  Discussed the following assessment and plan:  Vaginal discharge - Plan: Cervicovaginal ancillary only No dx  Specific    Affirm  Wet prep  Can try acijel but if progressive concenring sx then get back with uys   -Patient advised to return or notify health care team  if  new concerns arise.  Patient Instructions   Exam is ok  Thin discharge will let you know  when results are back .   Can try Aci-Jel   Vaginal  otc  And see if helps or do nothing and follow      Mariann Laster K. Taqwa Deem M.D.

## 2019-12-31 NOTE — Patient Instructions (Addendum)
  Exam is ok  Thin discharge will let you know  when results are back .   Can try Aci-Jel   Vaginal  otc  And see if helps or do nothing and follow

## 2020-01-04 NOTE — Progress Notes (Signed)
Tell patient neg for year    get the  rest  those test results that we added on   BV and Trich   since I dont see then  thanks

## 2020-01-07 LAB — CERVICOVAGINAL ANCILLARY ONLY
Bacterial Vaginitis (gardnerella): POSITIVE — AB
Candida Glabrata: NEGATIVE
Candida Glabrata: NEGATIVE
Candida Vaginitis: NEGATIVE
Candida Vaginitis: NEGATIVE
Comment: NEGATIVE
Comment: NEGATIVE
Comment: NEGATIVE
Comment: NEGATIVE
Comment: NEGATIVE
Comment: NEGATIVE
Trichomonas: NEGATIVE

## 2020-01-08 MED ORDER — METRONIDAZOLE 0.75 % VA GEL: 1.0000 | Freq: Two times a day (BID) | VAGINAL | 0 refills | Status: DC

## 2020-01-08 NOTE — Progress Notes (Signed)
Vaginal shows  possible bacterial vaginosis ( not serious) but can rx with vaginal metronidazole ( but can come back  at times  )  Can send in metrogel vaginal  gel to use  one application intravaginally   1-2 x per day for 5 days

## 2020-01-08 NOTE — Addendum Note (Signed)
Addended by: Raiford Simmonds R on: 01/08/2020 02:19 PM   Modules accepted: Orders

## 2020-01-15 ENCOUNTER — Encounter: Payer: Self-pay | Admitting: Sports Medicine

## 2020-01-15 ENCOUNTER — Other Ambulatory Visit: Payer: Self-pay

## 2020-01-15 ENCOUNTER — Ambulatory Visit (INDEPENDENT_AMBULATORY_CARE_PROVIDER_SITE_OTHER): Payer: BC Managed Care – PPO | Admitting: Sports Medicine

## 2020-01-15 ENCOUNTER — Ambulatory Visit (INDEPENDENT_AMBULATORY_CARE_PROVIDER_SITE_OTHER): Payer: BC Managed Care – PPO

## 2020-01-15 VITALS — Temp 96.8°F

## 2020-01-15 DIAGNOSIS — M2041 Other hammer toe(s) (acquired), right foot: Secondary | ICD-10-CM

## 2020-01-15 DIAGNOSIS — M7741 Metatarsalgia, right foot: Secondary | ICD-10-CM

## 2020-01-15 DIAGNOSIS — Z9889 Other specified postprocedural states: Secondary | ICD-10-CM

## 2020-01-15 DIAGNOSIS — M79671 Pain in right foot: Secondary | ICD-10-CM

## 2020-01-15 DIAGNOSIS — M2011 Hallux valgus (acquired), right foot: Secondary | ICD-10-CM

## 2020-01-15 NOTE — Progress Notes (Signed)
    Subjective: MARYELLA ABOOD is a 59 y.o. female patient seen today in office for POV #6 (DOS 10/08/2019), S/P right Akin, right plantar plate repair with flexor tendon transfer at the second metatarsophalangeal joint, third through fifth hammertoe repair with shortening osteotomy. Patient reports that she is doing good in her work shoe with no major issues with a little swelling, denies calf pain, denies headache, chest pain, shortness of breath, nausea, vomiting, fever, or chills.  No other issues noted.  Patient Active Problem List   Diagnosis Date Noted  . H/O hysterectomy for benign disease 11/02/2012  . Capsulitis of ankle or foot 07/14/2011  . FREQUENCY, URINARY 07/01/2010  . Leukocytopenia 03/20/2010  . IRON DEFICIENCY 02/20/2010  . HEADACHE 02/10/2010  . URINALYSIS, ABNORMAL 02/10/2010  . OSTEOPOROSIS 04/19/2008    Current Outpatient Medications on File Prior to Visit  Medication Sig Dispense Refill  . docusate sodium (COLACE) 100 MG capsule Take 1 capsule (100 mg total) by mouth 2 (two) times daily. 10 capsule 0  . ibuprofen (ADVIL) 800 MG tablet Take 1 tablet (800 mg total) by mouth every 8 (eight) hours as needed. 30 tablet 0  . lisinopril (PRINIVIL,ZESTRIL) 10 MG tablet TAKE 1 TABLET DAILY 90 tablet 4  . metroNIDAZOLE (METROGEL VAGINAL) 0.75 % vaginal gel Place 1 Applicatorful vaginally 2 (two) times daily. 70 g 0  . promethazine (PHENERGAN) 25 MG tablet Take 1 tablet (25 mg total) by mouth every 8 (eight) hours as needed for nausea or vomiting. 20 tablet 0   No current facility-administered medications on file prior to visit.    No Known Allergies  Objective: There were no vitals filed for this visit.  General: No acute distress, AAOx3  Right foot foot: Incisions well healed, mild swelling, no erythema, no warmth, no drainage, no signs of infection noted, Capillary fill time <3 seconds in all digits, gross sensation present via light touch to right foot. No pain or  crepitation with range of motion right foot. Range of motion acceptable for post op status.  No pain with calf compression.    Xrays- consistent with post op status, bone healing well.   Assessment and Plan:  Problem List Items Addressed This Visit    None    Visit Diagnoses    Hammer toe of right foot    -  Primary   Relevant Orders   DG Foot Complete Right (Completed)   Metatarsalgia, right foot       S/P foot surgery, right       Acquired hallux interphalangeus of right foot       Acquired hallux valgus of right foot       Pain in right foot         -Patient seen and evaluated -Xrays consistent with post op status  -Continue with normal shoe and may use orthotic as needed -Continue with scar creams are directed  -Return to work no restrictions  -Return PRN. In the meantime, patient to call office if any issues or problems arise or when ready later this year for surgery on the left.   Asencion Islam, DPM

## 2020-02-06 ENCOUNTER — Other Ambulatory Visit: Payer: Self-pay | Admitting: Internal Medicine

## 2020-02-23 ENCOUNTER — Ambulatory Visit: Payer: BC Managed Care – PPO | Attending: Internal Medicine

## 2020-02-23 DIAGNOSIS — Z23 Encounter for immunization: Secondary | ICD-10-CM

## 2020-02-23 NOTE — Progress Notes (Addendum)
   Covid-19 Vaccination Clinic  Name:  Sue Golden    MRN: [id number] DOB: 06/28/1961  02/23/2020  [name] was observed post Covid-19 immunization for 15 minutes without incident. She was provided with Vaccine Information Sheet and instruction to access the V-Safe system.   [name] was instructed to call 911 with any severe reactions post vaccine: Marland Kitchen Difficulty breathing  . Swelling of face and throat  . A fast heartbeat  . A bad rash all over body  . Dizziness and weakness   Immunizations Administered    Name Date Dose VIS Date Route   Pfizer COVID-19 Vaccine 02/23/2020  8:24 AM 0.3 mL 11/23/2019 Intramuscular   Manufacturer: [employer], [employer]   Lot: [id number]   NDC: [address]-3

## 2020-03-10 ENCOUNTER — Ambulatory Visit: Payer: BC Managed Care – PPO

## 2020-03-17 ENCOUNTER — Ambulatory Visit: Payer: BC Managed Care – PPO | Attending: Internal Medicine

## 2020-03-17 ENCOUNTER — Ambulatory Visit: Payer: BC Managed Care – PPO

## 2020-03-17 DIAGNOSIS — Z23 Encounter for immunization: Secondary | ICD-10-CM

## 2020-03-17 NOTE — Progress Notes (Signed)
   Covid-19 Vaccination Clinic  Name:  Sue Golden    MRN: [id number] DOB: 1961/02/28  03/17/2020  Sue Golden was observed post Covid-19 immunization for 15 minutes without incident. She was provided with Vaccine Information Sheet and instruction to access the V-Safe system.   Sue Golden was instructed to call 911 with any severe reactions post vaccine: Marland Kitchen Difficulty breathing  . Swelling of face and throat  . A fast heartbeat  . A bad rash all over body  . Dizziness and weakness   Immunizations Administered    Name Date Dose VIS Date Route   Pfizer COVID-19 Vaccine 03/17/2020 10:46 AM 0.3 mL 11/23/2019 Intramuscular   Manufacturer: [employer], [employer]   Lot: [id number]   NDC: [address]-7

## 2020-04-01 ENCOUNTER — Other Ambulatory Visit: Payer: Self-pay

## 2020-04-01 ENCOUNTER — Encounter: Payer: Self-pay | Admitting: Sports Medicine

## 2020-04-01 ENCOUNTER — Ambulatory Visit (INDEPENDENT_AMBULATORY_CARE_PROVIDER_SITE_OTHER): Payer: BC Managed Care – PPO

## 2020-04-01 ENCOUNTER — Ambulatory Visit (INDEPENDENT_AMBULATORY_CARE_PROVIDER_SITE_OTHER): Payer: BC Managed Care – PPO | Admitting: Sports Medicine

## 2020-04-01 VITALS — Temp 97.2°F

## 2020-04-01 DIAGNOSIS — Z9889 Other specified postprocedural states: Secondary | ICD-10-CM

## 2020-04-01 DIAGNOSIS — M2011 Hallux valgus (acquired), right foot: Secondary | ICD-10-CM

## 2020-04-01 DIAGNOSIS — M2041 Other hammer toe(s) (acquired), right foot: Secondary | ICD-10-CM

## 2020-04-01 DIAGNOSIS — M7741 Metatarsalgia, right foot: Secondary | ICD-10-CM | POA: Diagnosis not present

## 2020-04-01 NOTE — Progress Notes (Signed)
Subjective: Sue Golden is a 59 y.o. female patient seen today in office for POV #7 (DOS 10/08/2019), S/P right Akin, right plantar plate repair with flexor tendon transfer at the second metatarsophalangeal joint, third through fifth hammertoe repair with shortening osteotomy. Patient reports that she is doing good but does have some ocassional dull pain at 2nd toe and some weird bunched up feeling at right 3-4 toe with a little swelling, no denies calf pain, denies headache, chest pain, shortness of breath, nausea, vomiting, fever, or chills.  No other issues noted.  Patient Active Problem List   Diagnosis Date Noted  . H/O hysterectomy for benign disease 11/02/2012  . Capsulitis of ankle or foot 07/14/2011  . FREQUENCY, URINARY 07/01/2010  . Leukocytopenia 03/20/2010  . IRON DEFICIENCY 02/20/2010  . HEADACHE 02/10/2010  . URINALYSIS, ABNORMAL 02/10/2010  . OSTEOPOROSIS 04/19/2008    Current Outpatient Medications on File Prior to Visit  Medication Sig Dispense Refill  . lisinopril (ZESTRIL) 10 MG tablet TAKE 1 TABLET DAILY 90 tablet 3   No current facility-administered medications on file prior to visit.    No Known Allergies  Objective: There were no vitals filed for this visit.  General: No acute distress, AAOx3  Right foot foot: Incisions well healed, mild swelling to toes especially 3-4, no erythema, no warmth, no drainage, no signs of infection noted, Capillary fill time <3 seconds in all digits, gross sensation present via light touch to right foot with occassional subjective bunched up feeling when in work shoes. No pain or crepitation with range of motion right foot. Range of motion acceptable for post op status.  No pain with calf compression.    Xrays as below.  Assessment and Plan:  Problem List Items Addressed This Visit    None    Visit Diagnoses    Hammer toe of right foot    -  Primary   Relevant Orders   DG Foot Complete Right (Completed)   S/P foot  surgery, right       Relevant Orders   DG Foot Complete Right (Completed)   Metatarsalgia, right foot       Acquired hallux interphalangeus of right foot         -Patient seen and evaluated -Xrays consistent with post op status no acute concerns -Continue with normal shoe and may use met pad and then if this works well may transition back to using orthotics  -Continue with scar creams are directed  -Continue with normal work duties  -Return PRN. In the meantime, patient to call office for any issues or problems.  Landis Martins, DPM

## 2020-12-10 ENCOUNTER — Encounter: Payer: BC Managed Care – PPO | Admitting: Internal Medicine

## 2020-12-20 ENCOUNTER — Ambulatory Visit: Payer: BC Managed Care – PPO | Attending: Internal Medicine

## 2020-12-20 DIAGNOSIS — Z23 Encounter for immunization: Secondary | ICD-10-CM

## 2020-12-20 NOTE — Progress Notes (Signed)
   Covid-19 Vaccination Clinic  Name:  Sue Golden    MRN: [id number] DOB: 1961/08/07  12/20/2020  Sue Golden was observed post Covid-19 immunization for 15 minutes without incident. She was provided with Vaccine Information Sheet and instruction to access the V-Safe system.   Sue Golden was instructed to call 911 with any severe reactions post vaccine: Marland Kitchen Difficulty breathing  . Swelling of face and throat  . A fast heartbeat  . A bad rash all over body  . Dizziness and weakness   Immunizations Administered    Name Date Dose VIS Date Route   Pfizer COVID-19 Vaccine 12/20/2020  9:50 AM 0.3 mL 10/01/2020 Intramuscular   Manufacturer: [employer], [employer]   Lot: [id number]   NDC: [address]-4

## 2021-01-08 ENCOUNTER — Other Ambulatory Visit: Payer: Self-pay | Admitting: Internal Medicine

## 2021-01-19 NOTE — Progress Notes (Signed)
Chief Complaint  Patient presents with  . Annual Exam  . Medication Management    HPI: Patient  Sue Golden  60 y.o. comes in today for Preventive Health Care visit   And medication check. Taking low-dose lisinopril no side effects blood pressure when checked is usually in range. Would like to be able to get off medicine for blood pressure. No major change in health has had toe feet surgery which has been somewhat helpful since she is on her feet all day. Health Maintenance  Topic Date Due  . HIV Screening  Never done  . COLONOSCOPY (Pts 45-24yrs Insurance coverage will need to be confirmed)  05/08/2020  . INFLUENZA VACCINE  03/12/2022 (Originally 07/13/2020)  . MAMMOGRAM  12/16/2021  . TETANUS/TDAP  09/04/2028  . COVID-19 Vaccine  Completed  . Hepatitis C Screening  Completed   Health Maintenance Review LIFESTYLE:  Exercise:  Walking  Warehouse    At work mostly over 10,000 steps. Tobacco/ETS:n Alcohol: n Sugar beverages:  no Sleep:  5-6  Drug use: no HH of   2  1 pet dog  Work: about  55 hours works 2 jobs   ROS:  GEN/ HEENT: No fever, significant weight changes sweats headaches vision problems hearing changes, CV/ PULM; No chest pain shortness of breath cough, syncope,edema  change in exercise tolerance. GI /GU: No adominal pain, vomiting, change in bowel habits. No blood in the stool. No significant GU symptoms. SKIN/HEME: ,no acute skin rashes suspicious lesions or bleeding. No lymphadenopathy, nodules, masses.  NEURO/ PSYCH:  No neurologic signs such as weakness numbness. No depression anxiety. IMM/ Allergy: No unusual infections.  Allergy .   REST of 12 system review negative except as per HPI   Past Medical History:  Diagnosis Date  . Anemia   . Chicken pox     Past Surgical History:  Procedure Laterality Date  . CESAREAN SECTION    . PARTIAL HYSTERECTOMY     for bledding 1994    Family History  Problem Relation Age of Onset  . Hypertension  Mother   . Diabetes Father   . Dementia Sister        in 29s now in nursingg home no dx    Social History   Socioeconomic History  . Marital status: Single    Spouse name: Not on file  . Number of children: Not on file  . Years of education: Not on file  . Highest education level: Not on file  Occupational History  . Not on file  Tobacco Use  . Smoking status: Former Games developer  . Smokeless tobacco: Never Used  Substance and Sexual Activity  . Alcohol use: Yes    Alcohol/week: 2.0 standard drinks    Types: 2 Glasses of wine per week  . Drug use: No  . Sexual activity: Not on file  Other Topics Concern  . Not on file  Social History Narrative   hh of 2    Pet dog   Working [employer] 8 - 5    Cleaning  5 days a week.    No tobacco   caffiene in am    ETOh.   On feet a lot    Social Determinants of Health   Financial Resource Strain: Not on file  Food Insecurity: Not on file  Transportation Needs: Not on file  Physical Activity: Not on file  Stress: Not on file  Social Connections: Not on file    Outpatient Medications  Prior to Visit  Medication Sig Dispense Refill  . lisinopril (ZESTRIL) 10 MG tablet TAKE 1 TABLET DAILY 90 tablet 3   No facility-administered medications prior to visit.     EXAM:  BP 128/84 (BP Location: Left Arm, Patient Position: Sitting, Cuff Size: Normal)   Pulse 90   Temp 98.2 F (36.8 C) (Oral)   Ht 5\' 4"  (1.626 m)   Wt 177 lb (80.3 kg)   LMP 12/13/1992 (Approximate)   SpO2 97%   BMI 30.38 kg/m   Body mass index is 30.38 kg/m. Wt Readings from Last 3 Encounters:  01/20/21 177 lb (80.3 kg)  12/31/19 174 lb 9.6 oz (79.2 kg)  09/14/19 178 lb 3.2 oz (80.8 kg)    Physical Exam: Vital signs reviewed 11/14/19 is a well-developed well-nourished alert cooperative    who appearsr stated age in no acute distress.  HEENT: normocephalic atraumatic , Eyes: PERRL EOM's full, conjunctiva clear, Nares: paten,t no deformity  discharge or tenderness., Ears: no deformity EAC's clear TMs with normal landmarks. Mouth: clear OP, masked NECK: supple without masses, thyromegaly or bruits. CHEST/PULM:  Clear to auscultation and percussion breath sounds equal no wheeze , rales or rhonchi. No chest wall deformities or tenderness. Breast: normal by inspection . No dimpling, discharge, masses, tenderness or discharge . CV: PMI is nondisplaced, S1 S2 no gallops, murmurs, rubs. Peripheral pulses are full without delay.No JVD .  ABDOMEN: Bowel sounds normal nontender  No guard or rebound, no hepato splenomegal no CVA tenderness.  Liver at right costal margin.  Nontender Extremtities:  No clubbing cyanosis or edema, no acute joint swelling or redness no focal atrophy NEURO:  Oriented x3, cranial nerves 3-12 appear to be intact, no obvious focal weakness,gait within normal limits no abnormal reflexes or asymmetrical SKIN: No acute rashes normal turgor, color, no bruising or petechiae.  Dark flat mole on left anterior shin no change PSYCH: Oriented, good eye contact, no obvious depression anxiety, cognition and judgment appear normal. LN: no cervical axillary inguinal adenopathy  Lab Results  Component Value Date   WBC 2.8 Repeated and verified X2. (L) 09/14/2019   HGB 11.8 (L) 09/14/2019   HCT 36.0 09/14/2019   PLT 193.0 09/14/2019   GLUCOSE 99 09/14/2019   CHOL 204 (H) 09/14/2019   TRIG 58.0 09/14/2019   HDL 61.70 09/14/2019   LDLDIRECT 131.2 10/24/2012   LDLCALC 131 (H) 09/14/2019   ALT 13 09/14/2019   AST 12 09/14/2019   NA 140 09/14/2019   K 4.7 09/14/2019   CL 108 09/14/2019   CREATININE 0.85 09/14/2019   BUN 15 09/14/2019   CO2 27 09/14/2019   TSH 0.99 09/14/2019    BP Readings from Last 3 Encounters:  01/20/21 128/84  12/31/19 130/80  10/16/19 (!) 142/86    Lab plan  reviewed with patient   ASSESSMENT AND PLAN:  Discussed the following assessment and plan:    ICD-10-CM   1. Visit for preventive  health examination  Z00.00 Basic metabolic panel    CBC with Differential/Platelet    Hemoglobin A1c    Hepatic function panel    Lipid panel    Lipid panel    Hepatic function panel    Hemoglobin A1c    CBC with Differential/Platelet    Basic metabolic panel   need for colon cancer screening  2. Essential hypertension  I10 Basic metabolic panel    CBC with Differential/Platelet    Hemoglobin A1c    Hepatic function panel  Lipid panel    Lipid panel    Hepatic function panel    Hemoglobin A1c    CBC with Differential/Platelet    Basic metabolic panel  3. Medication management  Z79.899 Basic metabolic panel    CBC with Differential/Platelet    Hemoglobin A1c    Hepatic function panel    Lipid panel    Lipid panel    Hepatic function panel    Hemoglobin A1c    CBC with Differential/Platelet    Basic metabolic panel  4. Leukopenia, unspecified type  D72.819 Basic metabolic panel    CBC with Differential/Platelet    Hemoglobin A1c    Hepatic function panel    Lipid panel    Lipid panel    Hepatic function panel    Hemoglobin A1c    CBC with Differential/Platelet    Basic metabolic panel  5. H/O hysterectomy for benign disease  Z90.710 Basic metabolic panel    CBC with Differential/Platelet    Hemoglobin A1c    Hepatic function panel    Lipid panel    Lipid panel    Hepatic function panel    Hemoglobin A1c    CBC with Differential/Platelet    Basic metabolic panel  6. Family history of diabetes mellitus  Z83.3 Basic metabolic panel    CBC with Differential/Platelet    Hemoglobin A1c    Hepatic function panel    Lipid panel    Lipid panel    Hepatic function panel    Hemoglobin A1c    CBC with Differential/Platelet    Basic metabolic panel  7. BMI 30.0-30.9,adult  Z68.30   8. Influenza vaccination declined by patient  Z28.21    may consider infuture  colon cancer screening she can check with her insurance about Cologuard and let us know so we can place  order. Monitoring her blood count previously seen in consult with hematology doing yearly checks. She is interested on doing lifestyle to be able to get off medicine for high blood pressure. Discussed healthy weight loss may be quite helpful continue her exercise lower sodium diet management as possible sleep optimization other techniques.  Return in about 1 year (around 01/20/2022) for depending on results, preventive /cpx and medications.  Patient Care Team: Madelin Headings, MD as PCP - General Patient Instructions    Continue lifestyle intervention healthy eating and exercise .  Bp goal below 130/80 average   If elevated contact us and we can adjust medication .  Due for colon cancer screening . Check on [facility]  Screening test and let us know  So we can place order for you.       Health Maintenance, Female Adopting a healthy lifestyle and getting preventive care are important in promoting health and wellness. Ask your health care provider about:  The right schedule for you to have regular tests and exams.  Things you can do on your own to prevent diseases and keep yourself healthy. What should I know about diet, weight, and exercise? Eat a healthy diet  Eat a diet that includes plenty of vegetables, fruits, low-fat dairy products, and lean protein.  Do not eat a lot of foods that are high in solid fats, added sugars, or sodium.   Maintain a healthy weight Body mass index (BMI) is used to identify weight problems. It estimates body fat based on height and weight. Your health care provider can help determine your BMI and help you achieve or maintain a healthy weight. Get  regular exercise Get regular exercise. This is one of the most important things you can do for your health. Most adults should:  Exercise for at least 150 minutes each week. The exercise should increase your heart rate and make you sweat (moderate-intensity exercise).  Do strengthening exercises at least  twice a week. This is in addition to the moderate-intensity exercise.  Spend less time sitting. Even light physical activity can be beneficial. Watch cholesterol and blood lipids Have your blood tested for lipids and cholesterol at 61 years of age, then have this test every 5 years. Have your cholesterol levels checked more often if:  Your lipid or cholesterol levels are high.  You are older than 61 years of age.  You are at high risk for heart disease. What should I know about cancer screening? Depending on your health history and family history, you may need to have cancer screening at various ages. This may include screening for:  Breast cancer.  Cervical cancer.  Colorectal cancer.  Skin cancer.  Lung cancer. What should I know about heart disease, diabetes, and high blood pressure? Blood pressure and heart disease  High blood pressure causes heart disease and increases the risk of stroke. This is more likely to develop in people who have high blood pressure readings, are of African descent, or are overweight.  Have your blood pressure checked: ? Every 3-5 years if you are 6-45 years of age. ? Every year if you are 76 years old or older. Diabetes Have regular diabetes screenings. This checks your fasting blood sugar level. Have the screening done:  Once every three years after age 62 if you are at a normal weight and have a low risk for diabetes.  More often and at a younger age if you are overweight or have a high risk for diabetes. What should I know about preventing infection? Hepatitis B If you have a higher risk for hepatitis B, you should be screened for this virus. Talk with your health care provider to find out if you are at risk for hepatitis B infection. Hepatitis C Testing is recommended for:  Everyone born from [birth year] through 1966.  Anyone with known risk factors for hepatitis C. Sexually transmitted infections (STIs)  Get screened for STIs, including  gonorrhea and chlamydia, if: ? You are sexually active and are younger than 61 years of age. ? You are older than 61 years of age and your health care provider tells you that you are at risk for this type of infection. ? Your sexual activity has changed since you were last screened, and you are at increased risk for chlamydia or gonorrhea. Ask your health care provider if you are at risk.  Ask your health care provider about whether you are at high risk for HIV. Your health care provider may recommend a prescription medicine to help prevent HIV infection. If you choose to take medicine to prevent HIV, you should first get tested for HIV. You should then be tested every 3 months for as long as you are taking the medicine. Pregnancy  If you are about to stop having your period (premenopausal) and you may become pregnant, seek counseling before you get pregnant.  Take 400 to 800 micrograms (mcg) of folic acid every day if you become pregnant.  Ask for birth control (contraception) if you want to prevent pregnancy. Osteoporosis and menopause Osteoporosis is a disease in which the bones lose minerals and strength with aging. This can result in bone fractures.  If you are 389 years old or older, or if you are at risk for osteoporosis and fractures, ask your health care provider if you should:  Be screened for bone loss.  Take a calcium or vitamin D supplement to lower your risk of fractures.  Be given hormone replacement therapy (HRT) to treat symptoms of menopause. Follow these instructions at home: Lifestyle  Do not use any products that contain nicotine or tobacco, such as cigarettes, e-cigarettes, and chewing tobacco. If you need help quitting, ask your health care provider.  Do not use street drugs.  Do not share needles.  Ask your health care provider for help if you need support or information about quitting drugs. Alcohol use  Do not drink alcohol if: ? Your health care provider  tells you not to drink. ? You are pregnant, may be pregnant, or are planning to become pregnant.  If you drink alcohol: ? Limit how much you use to 0-1 drink a day. ? Limit intake if you are breastfeeding.  Be aware of how much alcohol is in your drink. In the [place] one drink equals one 12 oz bottle of beer (355 mL), one 5 oz glass of wine (148 mL), or one 1 oz glass of hard liquor (44 mL). General instructions  Schedule regular health, dental, and eye exams.  Stay current with your vaccines.  Tell your health care provider if: ? You often feel depressed. ? You have ever been abused or do not feel safe at home. Summary  Adopting a healthy lifestyle and getting preventive care are important in promoting health and wellness.  Follow your health care provider's instructions about healthy diet, exercising, and getting tested or screened for diseases.  Follow your health care provider's instructions on monitoring your cholesterol and blood pressure. This information is not intended to replace advice given to you by your health care provider. Make sure you discuss any questions you have with your health care provider. Document Revised: 11/22/2018 Document Reviewed: 11/22/2018 Elsevier Patient Education  2021 [employer]    Sue Golden M.D.

## 2021-01-20 ENCOUNTER — Encounter: Payer: Self-pay | Admitting: Internal Medicine

## 2021-01-20 ENCOUNTER — Other Ambulatory Visit: Payer: Self-pay

## 2021-01-20 ENCOUNTER — Ambulatory Visit (INDEPENDENT_AMBULATORY_CARE_PROVIDER_SITE_OTHER): Payer: BC Managed Care – PPO | Admitting: Internal Medicine

## 2021-01-20 VITALS — BP 128/84 | HR 90 | Temp 98.2°F | Ht 64.0 in | Wt 177.0 lb

## 2021-01-20 DIAGNOSIS — Z79899 Other long term (current) drug therapy: Secondary | ICD-10-CM

## 2021-01-20 DIAGNOSIS — Z6830 Body mass index (BMI) 30.0-30.9, adult: Secondary | ICD-10-CM

## 2021-01-20 DIAGNOSIS — Z0000 Encounter for general adult medical examination without abnormal findings: Secondary | ICD-10-CM | POA: Diagnosis not present

## 2021-01-20 DIAGNOSIS — I10 Essential (primary) hypertension: Secondary | ICD-10-CM

## 2021-01-20 DIAGNOSIS — D72819 Decreased white blood cell count, unspecified: Secondary | ICD-10-CM | POA: Diagnosis not present

## 2021-01-20 DIAGNOSIS — Z833 Family history of diabetes mellitus: Secondary | ICD-10-CM

## 2021-01-20 DIAGNOSIS — Z2821 Immunization not carried out because of patient refusal: Secondary | ICD-10-CM

## 2021-01-20 DIAGNOSIS — Z90710 Acquired absence of both cervix and uterus: Secondary | ICD-10-CM

## 2021-01-20 LAB — CBC WITH DIFFERENTIAL/PLATELET
Basophils Absolute: 0 10*3/uL (ref 0.0–0.1)
Basophils Relative: 0.7 % (ref 0.0–3.0)
Eosinophils Absolute: 0.1 10*3/uL (ref 0.0–0.7)
Eosinophils Relative: 1.9 % (ref 0.0–5.0)
HCT: 36.6 % (ref 36.0–46.0)
Hemoglobin: 12.3 g/dL (ref 12.0–15.0)
Lymphocytes Relative: 52.8 % — ABNORMAL HIGH (ref 12.0–46.0)
Lymphs Abs: 1.4 10*3/uL (ref 0.7–4.0)
MCHC: 33.7 g/dL (ref 30.0–36.0)
MCV: 89.2 fl (ref 78.0–100.0)
Monocytes Absolute: 0.3 10*3/uL (ref 0.1–1.0)
Monocytes Relative: 11.3 % (ref 3.0–12.0)
Neutro Abs: 0.9 10*3/uL — ABNORMAL LOW (ref 1.4–7.7)
Neutrophils Relative %: 33.3 % — ABNORMAL LOW (ref 43.0–77.0)
Platelets: 209 10*3/uL (ref 150.0–400.0)
RBC: 4.1 Mil/uL (ref 3.87–5.11)
RDW: 13.3 % (ref 11.5–15.5)
WBC: 2.7 10*3/uL — ABNORMAL LOW (ref 4.0–10.5)

## 2021-01-20 LAB — LIPID PANEL
Cholesterol: 205 mg/dL — ABNORMAL HIGH (ref 0–200)
HDL: 55 mg/dL (ref >39.00)
LDL Cholesterol: 133 mg/dL — ABNORMAL HIGH (ref 0–99)
NonHDL: 149.74
Total CHOL/HDL Ratio: 4
Triglycerides: 85 mg/dL (ref 0.0–149.0)
VLDL: 17 mg/dL (ref 0.0–40.0)

## 2021-01-20 LAB — HEPATIC FUNCTION PANEL
ALT: 15 U/L (ref 0–35)
AST: 14 U/L (ref 0–37)
Albumin: 4.1 g/dL (ref 3.5–5.2)
Alkaline Phosphatase: 67 U/L (ref 39–117)
Bilirubin, Direct: 0.1 mg/dL (ref 0.0–0.3)
Total Bilirubin: 0.5 mg/dL (ref 0.2–1.2)
Total Protein: 7.7 g/dL (ref 6.0–8.3)

## 2021-01-20 LAB — BASIC METABOLIC PANEL
BUN: 13 mg/dL (ref 6–23)
CO2: 29 mEq/L (ref 19–32)
Calcium: 10.2 mg/dL (ref 8.4–10.5)
Chloride: 108 mEq/L (ref 96–112)
Creatinine, Ser: 1 mg/dL (ref 0.40–1.20)
GFR: 61.3 mL/min (ref >60.00)
Glucose, Bld: 99 mg/dL (ref 70–99)
Potassium: 5.4 mEq/L — ABNORMAL HIGH (ref 3.5–5.1)
Sodium: 140 mEq/L (ref 135–145)

## 2021-01-20 LAB — HEMOGLOBIN A1C: Hgb A1c MFr Bld: 6.2 % (ref 4.6–6.5)

## 2021-01-20 MED ORDER — LISINOPRIL 10 MG PO TABS: 10.0000 mg | ORAL_TABLET | Freq: Every day | ORAL | 3 refills | Status: DC

## 2021-01-20 NOTE — Patient Instructions (Addendum)
Continue lifestyle intervention healthy eating and exercise .  Bp goal below 130/80 average   If elevated contact us and we can adjust medication .  Due for colon cancer screening . Check on [facility]  Screening test and let us know  So we can place order for you.       Health Maintenance, Female Adopting a healthy lifestyle and getting preventive care are important in promoting health and wellness. Ask your health care provider about:  The right schedule for you to have regular tests and exams.  Things you can do on your own to prevent diseases and keep yourself healthy. What should I know about diet, weight, and exercise? Eat a healthy diet  Eat a diet that includes plenty of vegetables, fruits, low-fat dairy products, and lean protein.  Do not eat a lot of foods that are high in solid fats, added sugars, or sodium.   Maintain a healthy weight Body mass index (BMI) is used to identify weight problems. It estimates body fat based on height and weight. Your health care provider can help determine your BMI and help you achieve or maintain a healthy weight. Get regular exercise Get regular exercise. This is one of the most important things you can do for your health. Most adults should:  Exercise for at least 150 minutes each week. The exercise should increase your heart rate and make you sweat (moderate-intensity exercise).  Do strengthening exercises at least twice a week. This is in addition to the moderate-intensity exercise.  Spend less time sitting. Even light physical activity can be beneficial. Watch cholesterol and blood lipids Have your blood tested for lipids and cholesterol at 61 years of age, then have this test every 5 years. Have your cholesterol levels checked more often if:  Your lipid or cholesterol levels are high.  You are older than 61 years of age.  You are at high risk for heart disease. What should I know about cancer screening? Depending on your  health history and family history, you may need to have cancer screening at various ages. This may include screening for:  Breast cancer.  Cervical cancer.  Colorectal cancer.  Skin cancer.  Lung cancer. What should I know about heart disease, diabetes, and high blood pressure? Blood pressure and heart disease  High blood pressure causes heart disease and increases the risk of stroke. This is more likely to develop in people who have high blood pressure readings, are of African descent, or are overweight.  Have your blood pressure checked: ? Every 3-5 years if you are 31-56 years of age. ? Every year if you are 69 years old or older. Diabetes Have regular diabetes screenings. This checks your fasting blood sugar level. Have the screening done:  Once every three years after age 62 if you are at a normal weight and have a low risk for diabetes.  More often and at a younger age if you are overweight or have a high risk for diabetes. What should I know about preventing infection? Hepatitis B If you have a higher risk for hepatitis B, you should be screened for this virus. Talk with your health care provider to find out if you are at risk for hepatitis B infection. Hepatitis C Testing is recommended for:  Everyone born from [birth year] through 1966.  Anyone with known risk factors for hepatitis C. Sexually transmitted infections (STIs)  Get screened for STIs, including gonorrhea and chlamydia, if: ? You are sexually active and are younger  than 61 years of age. ? You are older than 61 years of age and your health care provider tells you that you are at risk for this type of infection. ? Your sexual activity has changed since you were last screened, and you are at increased risk for chlamydia or gonorrhea. Ask your health care provider if you are at risk.  Ask your health care provider about whether you are at high risk for HIV. Your health care provider may recommend a prescription  medicine to help prevent HIV infection. If you choose to take medicine to prevent HIV, you should first get tested for HIV. You should then be tested every 3 months for as long as you are taking the medicine. Pregnancy  If you are about to stop having your period (premenopausal) and you may become pregnant, seek counseling before you get pregnant.  Take 400 to 800 micrograms (mcg) of folic acid every day if you become pregnant.  Ask for birth control (contraception) if you want to prevent pregnancy. Osteoporosis and menopause Osteoporosis is a disease in which the bones lose minerals and strength with aging. This can result in bone fractures. If you are 53 years old or older, or if you are at risk for osteoporosis and fractures, ask your health care provider if you should:  Be screened for bone loss.  Take a calcium or vitamin D supplement to lower your risk of fractures.  Be given hormone replacement therapy (HRT) to treat symptoms of menopause. Follow these instructions at home: Lifestyle  Do not use any products that contain nicotine or tobacco, such as cigarettes, e-cigarettes, and chewing tobacco. If you need help quitting, ask your health care provider.  Do not use street drugs.  Do not share needles.  Ask your health care provider for help if you need support or information about quitting drugs. Alcohol use  Do not drink alcohol if: ? Your health care provider tells you not to drink. ? You are pregnant, may be pregnant, or are planning to become pregnant.  If you drink alcohol: ? Limit how much you use to 0-1 drink a day. ? Limit intake if you are breastfeeding.  Be aware of how much alcohol is in your drink. In the [place] one drink equals one 12 oz bottle of beer (355 mL), one 5 oz glass of wine (148 mL), or one 1 oz glass of hard liquor (44 mL). General instructions  Schedule regular health, dental, and eye exams.  Stay current with your vaccines.  Tell your health  care provider if: ? You often feel depressed. ? You have ever been abused or do not feel safe at home. Summary  Adopting a healthy lifestyle and getting preventive care are important in promoting health and wellness.  Follow your health care provider's instructions about healthy diet, exercising, and getting tested or screened for diseases.  Follow your health care provider's instructions on monitoring your cholesterol and blood pressure. This information is not intended to replace advice given to you by your health care provider. Make sure you discuss any questions you have with your health care provider. Document Revised: 11/22/2018 Document Reviewed: 11/22/2018 Elsevier Patient Education  2021 [employer].

## 2021-01-27 NOTE — Progress Notes (Signed)
Blood count about the same no anemia Cholesterol about the same average could improve a bit with lifestyle total cholesterol 205 LDL is 133 Liver and kidney function are normal  potassium is  elevated .  uncertain if this is a medicine effect ( lisinopril)  a or a lab drawing lab effect .    Please make sure you are not taking an potassium supplements   Arrange repeat BMP nonfasting is okay with attention to water hydration before specimen is drawn. Dx elevated potassium

## 2021-01-30 ENCOUNTER — Telehealth: Payer: Self-pay | Admitting: Internal Medicine

## 2021-01-30 DIAGNOSIS — I10 Essential (primary) hypertension: Secondary | ICD-10-CM

## 2021-01-30 DIAGNOSIS — E875 Hyperkalemia: Secondary | ICD-10-CM

## 2021-01-30 DIAGNOSIS — Z79899 Other long term (current) drug therapy: Secondary | ICD-10-CM

## 2021-01-30 NOTE — Telephone Encounter (Signed)
Patient states she is supposed to be getting her potassium checked and was told she didn't have to fast, however i'm not seeing potassium lab orders in the system.  Also, the lab orders in the system is for fasting labs, so patient is unsure of when to make her appointment.  She is requesting a call back.

## 2021-02-03 NOTE — Telephone Encounter (Signed)
I placed the orders for future labs. She does not have to fast just be hydrated i.e. enough water.

## 2021-02-04 NOTE — Telephone Encounter (Signed)
Spoke with patient,lab appointment scheduled for 02/09/2021 at 4:20 pm

## 2021-02-09 ENCOUNTER — Other Ambulatory Visit: Payer: Self-pay

## 2021-02-09 ENCOUNTER — Other Ambulatory Visit (INDEPENDENT_AMBULATORY_CARE_PROVIDER_SITE_OTHER): Payer: BC Managed Care – PPO

## 2021-02-09 DIAGNOSIS — E875 Hyperkalemia: Secondary | ICD-10-CM | POA: Diagnosis not present

## 2021-02-09 DIAGNOSIS — Z79899 Other long term (current) drug therapy: Secondary | ICD-10-CM

## 2021-02-09 DIAGNOSIS — I10 Essential (primary) hypertension: Secondary | ICD-10-CM | POA: Diagnosis not present

## 2021-02-10 LAB — BASIC METABOLIC PANEL
BUN: 16 mg/dL (ref 6–23)
CO2: 24 mEq/L (ref 19–32)
Calcium: 10.1 mg/dL (ref 8.4–10.5)
Chloride: 107 mEq/L (ref 96–112)
Creatinine, Ser: 0.77 mg/dL (ref 0.40–1.20)
GFR: 83.85 mL/min (ref >60.00)
Glucose, Bld: 91 mg/dL (ref 70–99)
Potassium: 4 mEq/L (ref 3.5–5.1)
Sodium: 137 mEq/L (ref 135–145)

## 2021-02-10 NOTE — Progress Notes (Signed)
Repeat potassium level is normal no further action needed.

## 2021-02-27 LAB — HM MAMMOGRAPHY

## 2021-03-05 ENCOUNTER — Encounter: Payer: Self-pay | Admitting: Internal Medicine

## 2022-01-04 ENCOUNTER — Other Ambulatory Visit: Payer: Self-pay | Admitting: Internal Medicine

## 2022-12-01 LAB — HM MAMMOGRAPHY

## 2022-12-07 ENCOUNTER — Encounter: Payer: Self-pay | Admitting: Internal Medicine

## 2022-12-15 ENCOUNTER — Encounter: Payer: BC Managed Care – PPO | Admitting: Internal Medicine

## 2022-12-27 ENCOUNTER — Ambulatory Visit (INDEPENDENT_AMBULATORY_CARE_PROVIDER_SITE_OTHER): Payer: BC Managed Care – PPO | Admitting: Family

## 2022-12-27 VITALS — BP 144/92 | HR 78 | Temp 97.8°F | Ht 63.75 in | Wt 181.0 lb

## 2022-12-27 DIAGNOSIS — Z1211 Encounter for screening for malignant neoplasm of colon: Secondary | ICD-10-CM

## 2022-12-27 DIAGNOSIS — L658 Other specified nonscarring hair loss: Secondary | ICD-10-CM

## 2022-12-27 DIAGNOSIS — M818 Other osteoporosis without current pathological fracture: Secondary | ICD-10-CM | POA: Diagnosis not present

## 2022-12-27 DIAGNOSIS — Z0000 Encounter for general adult medical examination without abnormal findings: Secondary | ICD-10-CM | POA: Diagnosis not present

## 2022-12-27 LAB — LIPID PANEL
Cholesterol: 243 mg/dL — ABNORMAL HIGH (ref 0–200)
HDL: 61 mg/dL (ref >39.00)
LDL Cholesterol: 171 mg/dL — ABNORMAL HIGH (ref 0–99)
NonHDL: 182.25
Total CHOL/HDL Ratio: 4
Triglycerides: 57 mg/dL (ref 0.0–149.0)
VLDL: 11.4 mg/dL (ref 0.0–40.0)

## 2022-12-27 LAB — CBC WITH DIFFERENTIAL/PLATELET
Basophils Absolute: 0 10*3/uL (ref 0.0–0.1)
Basophils Relative: 0.4 % (ref 0.0–3.0)
Eosinophils Absolute: 0 10*3/uL (ref 0.0–0.7)
Eosinophils Relative: 1.6 % (ref 0.0–5.0)
HCT: 39.5 % (ref 36.0–46.0)
Hemoglobin: 13.1 g/dL (ref 12.0–15.0)
Lymphocytes Relative: 53.2 % — ABNORMAL HIGH (ref 12.0–46.0)
Lymphs Abs: 1.5 10*3/uL (ref 0.7–4.0)
MCHC: 33.2 g/dL (ref 30.0–36.0)
MCV: 90.1 fl (ref 78.0–100.0)
Monocytes Absolute: 0.3 10*3/uL (ref 0.1–1.0)
Monocytes Relative: 12.2 % — ABNORMAL HIGH (ref 3.0–12.0)
Neutro Abs: 0.9 10*3/uL — ABNORMAL LOW (ref 1.4–7.7)
Neutrophils Relative %: 32.6 % — ABNORMAL LOW (ref 43.0–77.0)
Platelets: 232 10*3/uL (ref 150.0–400.0)
RBC: 4.38 Mil/uL (ref 3.87–5.11)
RDW: 13.4 % (ref 11.5–15.5)
WBC: 2.8 10*3/uL — ABNORMAL LOW (ref 4.0–10.5)

## 2022-12-27 LAB — COMPREHENSIVE METABOLIC PANEL
ALT: 14 U/L (ref 0–35)
AST: 13 U/L (ref 0–37)
Albumin: 4.3 g/dL (ref 3.5–5.2)
Alkaline Phosphatase: 68 U/L (ref 39–117)
BUN: 14 mg/dL (ref 6–23)
CO2: 26 mEq/L (ref 19–32)
Calcium: 10.3 mg/dL (ref 8.4–10.5)
Chloride: 106 mEq/L (ref 96–112)
Creatinine, Ser: 0.94 mg/dL (ref 0.40–1.20)
GFR: 65.13 mL/min (ref >60.00)
Glucose, Bld: 104 mg/dL — ABNORMAL HIGH (ref 70–99)
Potassium: 5 mEq/L (ref 3.5–5.1)
Sodium: 138 mEq/L (ref 135–145)
Total Bilirubin: 0.5 mg/dL (ref 0.2–1.2)
Total Protein: 7.9 g/dL (ref 6.0–8.3)

## 2022-12-27 LAB — TSH: TSH: 0.54 u[IU]/mL (ref 0.35–5.50)

## 2022-12-27 MED ORDER — LOSARTAN POTASSIUM 25 MG PO TABS: 25.0000 mg | ORAL_TABLET | Freq: Every day | ORAL | 0 refills | Status: DC

## 2022-12-27 NOTE — Progress Notes (Signed)
Complete physical exam  Patient: Sue Golden   DOB: 02/06/1961   62 y.o. Female  MRN: [id number]  Subjective:    Chief Complaint  Patient presents with  . Annual Exam    Sue Golden is a 62 y.o. female who presents today for a complete physical exam. She reports consuming a general diet. The patient has a physically strenuous job, but has no regular exercise apart from work.  She generally feels well. She reports sleeping fairly well. She does have additional problems to discuss today.   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 She understands that she has a very long waiting list but would like to be referred.   Patient is also growing more concerned about potential side effects of Lisinopril and wants to discuss options for a different medication   Most recent fall risk assessment:    12/27/2022    1:31 PM  [place] in the past year? 0  Number falls in past yr: 0  Injury with Fall? 0  Risk for fall due to : No Fall Risks  Follow up Falls evaluation completed     Most recent depression screenings:    12/27/2022    1:31 PM 01/20/2021    8:43 AM  PHQ 2/9 Scores  PHQ - 2 Score 0 1  PHQ- 9 Score  6        Patient Care Team: Burnis Medin, MD as PCP - General   Outpatient Medications Prior to Visit  Medication Sig  . [DISCONTINUED] lisinopril (ZESTRIL) 10 MG tablet TAKE 1 TABLET DAILY   No facility-administered medications prior to visit.    Review of Systems  Skin:        Hair loss around the front and side of the hairline  All other systems reviewed and are negative.        Objective:     BP (!) 144/92 (BP Location: Right Arm, Patient Position: Sitting, Cuff Size: Normal)   Pulse 78   Temp 97.8 F (36.6 C) (Oral)   Ht 5' 3.75" (1.619 m)   Wt 181 lb (82.1 kg)   LMP 12/13/1992 (Approximate)   SpO2 97%   BMI 31.31 kg/m    Physical Exam Vitals and nursing note reviewed.  Constitutional:       Appearance: Normal appearance. She is normal weight.  HENT:     Head: Normocephalic.     Right Ear: Tympanic membrane, ear canal and external ear normal.     Left Ear: Tympanic membrane, ear canal and external ear normal.     Nose: Nose normal.     Mouth/Throat:     Mouth: Mucous membranes are moist.  Eyes:     Conjunctiva/sclera: Conjunctivae normal.     Pupils: Pupils are equal, round, and reactive to light.  Cardiovascular:     Rate and Rhythm: Normal rate and regular rhythm.     Pulses: Normal pulses.     Heart sounds: Normal heart sounds.  Pulmonary:     Effort: Pulmonary effort is normal.     Breath sounds: Normal breath sounds.  Abdominal:     General: Abdomen is flat. Bowel sounds are normal.     Palpations: Abdomen is soft.     Tenderness: There is no abdominal tenderness. There is no guarding or rebound.  Musculoskeletal:        General: Normal range of motion.     Cervical back: Normal  range of motion and neck supple.  Skin:    General: Skin is warm and dry.     Comments: Hairline receeding across the front from ear to ear. Hair appears full around the back.   Neurological:     General: No focal deficit present.     Mental Status: She is alert and oriented to person, place, and time. Mental status is at baseline.  Psychiatric:        Mood and Affect: Mood normal.        Behavior: Behavior normal.     No results found for any visits on 12/27/22.     Assessment & Plan:    Routine Health Maintenance and Physical Exam  Immunization History  Administered Date(s) Administered  . PFIZER(Purple Top)SARS-COV-2 Vaccination 02/23/2020, 03/17/2020, 12/20/2020  . Td 04/19/2008, 09/04/2018    Health Maintenance  Topic Date Due  . HIV Screening  Never done  . COLONOSCOPY (Pts 45-24yrs Insurance coverage will need to be confirmed)  05/08/2020  . COVID-19 Vaccine (4 - 2023-24 season) 08/13/2022  . INFLUENZA VACCINE  03/13/2023 (Originally 07/13/2022)  . Zoster Vaccines-  Shingrix (1 of 2) 03/28/2023 (Originally 09/12/1979)  . MAMMOGRAM  12/02/2023  . DTaP/Tdap/Td (3 - Tdap) 09/04/2028  . Hepatitis C Screening  Completed  . HPV VACCINES  Aged Out    Discussed health benefits of physical activity, and encouraged her to engage in regular exercise appropriate for her age and condition.  Problem List Items Addressed This Visit     Osteoporosis   Other Visit Diagnoses     Colon cancer screening    -  Primary   Relevant Orders   Cologuard   Traction alopecia       Relevant Orders   Ambulatory referral to Dermatology   TSH   Routine general medical examination at a health care facility       Relevant Orders   CBC with Differential   Comprehensive metabolic panel   Lipid panel   TSH       D/C Lisinopril. Start Losartan 25 mg once daily. Check blood pressure daily and send readings back to [place] in 2 weeks. Referral placed to Dr. Leonie Green but also encouraged her to see Dr. Jarome Matin at [facility]. Discussed possibly using Minoxidil but she is hesitant due to having to use it long term.   Return in 1 year (on 12/28/2023).     Kennyth Arnold, FNP

## 2022-12-27 NOTE — Patient Instructions (Addendum)
Dr. Jarome Matin Address: [address], [place], [place] [address] Phone: [phone]    Health Maintenance, Female Adopting a healthy lifestyle and getting preventive care are important in promoting health and wellness. Ask your health care provider about: The right schedule for you to have regular tests and exams. Things you can do on your own to prevent diseases and keep yourself healthy. What should I know about diet, weight, and exercise? Eat a healthy diet  Eat a diet that includes plenty of vegetables, fruits, low-fat dairy products, and lean protein. Do not eat a lot of foods that are high in solid fats, added sugars, or sodium. Maintain a healthy weight Body mass index (BMI) is used to identify weight problems. It estimates body fat based on height and weight. Your health care provider can help determine your BMI and help you achieve or maintain a healthy weight. Get regular exercise Get regular exercise. This is one of the most important things you can do for your health. Most adults should: Exercise for at least 150 minutes each week. The exercise should increase your heart rate and make you sweat (moderate-intensity exercise). Do strengthening exercises at least twice a week. This is in addition to the moderate-intensity exercise. Spend less time sitting. Even light physical activity can be beneficial. Watch cholesterol and blood lipids Have your blood tested for lipids and cholesterol at 63 years of age, then have this test every 5 years. Have your cholesterol levels checked more often if: Your lipid or cholesterol levels are high. You are older than 63 years of age. You are at high risk for heart disease. What should I know about cancer screening? Depending on your health history and family history, you may need to have cancer screening at various ages. This may include screening for: Breast cancer. Cervical cancer. Colorectal cancer. Skin cancer. Lung cancer. What  should I know about heart disease, diabetes, and high blood pressure? Blood pressure and heart disease High blood pressure causes heart disease and increases the risk of stroke. This is more likely to develop in people who have high blood pressure readings or are overweight. Have your blood pressure checked: Every 3-5 years if you are 29-44 years of age. Every year if you are 67 years old or older. Diabetes Have regular diabetes screenings. This checks your fasting blood sugar level. Have the screening done: Once every three years after age 53 if you are at a normal weight and have a low risk for diabetes. More often and at a younger age if you are overweight or have a high risk for diabetes. What should I know about preventing infection? Hepatitis B If you have a higher risk for hepatitis B, you should be screened for this virus. Talk with your health care provider to find out if you are at risk for hepatitis B infection. Hepatitis C Testing is recommended for: Everyone born from [birth year] through 1966. Anyone with known risk factors for hepatitis C. Sexually transmitted infections (STIs) Get screened for STIs, including gonorrhea and chlamydia, if: You are sexually active and are younger than 63 years of age. You are older than 63 years of age and your health care provider tells you that you are at risk for this type of infection. Your sexual activity has changed since you were last screened, and you are at increased risk for chlamydia or gonorrhea. Ask your health care provider if you are at risk. Ask your health care provider about whether you are at  high risk for HIV. Your health care provider may recommend a prescription medicine to help prevent HIV infection. If you choose to take medicine to prevent HIV, you should first get tested for HIV. You should then be tested every 3 months for as long as you are taking the medicine. Pregnancy If you are about to stop having your period  (premenopausal) and you may become pregnant, seek counseling before you get pregnant. Take 400 to 800 micrograms (mcg) of folic acid every day if you become pregnant. Ask for birth control (contraception) if you want to prevent pregnancy. Osteoporosis and menopause Osteoporosis is a disease in which the bones lose minerals and strength with aging. This can result in bone fractures. If you are 67 years old or older, or if you are at risk for osteoporosis and fractures, ask your health care provider if you should: Be screened for bone loss. Take a calcium or vitamin D supplement to lower your risk of fractures. Be given hormone replacement therapy (HRT) to treat symptoms of menopause. Follow these instructions at home: Alcohol use Do not drink alcohol if: Your health care provider tells you not to drink. You are pregnant, may be pregnant, or are planning to become pregnant. If you drink alcohol: Limit how much you have to: 0-1 drink a day. Know how much alcohol is in your drink. In the [place] one drink equals one 12 oz bottle of beer (355 mL), one 5 oz glass of wine (148 mL), or one 1 oz glass of hard liquor (44 mL). Lifestyle Do not use any products that contain nicotine or tobacco. These products include cigarettes, chewing tobacco, and vaping devices, such as e-cigarettes. If you need help quitting, ask your health care provider. Do not use street drugs. Do not share needles. Ask your health care provider for help if you need support or information about quitting drugs. General instructions Schedule regular health, dental, and eye exams. Stay current with your vaccines. Tell your health care provider if: You often feel depressed. You have ever been abused or do not feel safe at home. Summary Adopting a healthy lifestyle and getting preventive care are important in promoting health and wellness. Follow your health care provider's instructions about healthy diet, exercising, and getting  tested or screened for diseases. Follow your health care provider's instructions on monitoring your cholesterol and blood pressure. This information is not intended to replace advice given to you by your health care provider. Make sure you discuss any questions you have with your health care provider. Document Revised: 04/20/2021 Document Reviewed: 04/20/2021 Elsevier Patient Education  [place].

## 2022-12-28 ENCOUNTER — Other Ambulatory Visit: Payer: Self-pay

## 2022-12-28 DIAGNOSIS — E785 Hyperlipidemia, unspecified: Secondary | ICD-10-CM

## 2022-12-29 ENCOUNTER — Other Ambulatory Visit: Payer: Self-pay | Admitting: Internal Medicine

## 2023-01-06 ENCOUNTER — Encounter: Payer: BC Managed Care – PPO | Admitting: Internal Medicine

## 2023-02-01 LAB — COLOGUARD: COLOGUARD: NEGATIVE

## 2023-03-28 ENCOUNTER — Other Ambulatory Visit: Payer: BC Managed Care – PPO

## 2023-03-28 ENCOUNTER — Other Ambulatory Visit (INDEPENDENT_AMBULATORY_CARE_PROVIDER_SITE_OTHER): Payer: BC Managed Care – PPO

## 2023-03-28 DIAGNOSIS — E785 Hyperlipidemia, unspecified: Secondary | ICD-10-CM

## 2023-03-28 LAB — LIPID PANEL
Cholesterol: 184 mg/dL (ref 0–200)
HDL: 57.8 mg/dL (ref >39.00)
LDL Cholesterol: 112 mg/dL — ABNORMAL HIGH (ref 0–99)
NonHDL: 126.68
Total CHOL/HDL Ratio: 3
Triglycerides: 74 mg/dL (ref 0.0–149.0)
VLDL: 14.8 mg/dL (ref 0.0–40.0)

## 2023-04-04 ENCOUNTER — Other Ambulatory Visit: Payer: Self-pay | Admitting: Family

## 2023-04-04 ENCOUNTER — Telehealth: Payer: Self-pay | Admitting: Internal Medicine

## 2023-04-04 MED ORDER — LOSARTAN POTASSIUM 25 MG PO TABS: 25.0000 mg | ORAL_TABLET | Freq: Every day | ORAL | 0 refills | Status: DC

## 2023-04-04 NOTE — Telephone Encounter (Signed)
Refill  [facility] - [place], [place] - [address] [place] [address] Phone: [phone] Fax: [phone]    please advise at Mobile [phone] (mobile)

## 2023-04-07 MED ORDER — LOSARTAN POTASSIUM 25 MG PO TABS: 25.0000 mg | ORAL_TABLET | Freq: Every day | ORAL | 0 refills | Status: DC

## 2023-04-07 NOTE — Telephone Encounter (Signed)
Spoke to pt. Update pt.  Pt states she is pissed about [place] not getting back to her on time. She ran out of medication on Saturday. And called on Monday to [facility]. Get no follow. Call us on today. FD told her a prescription was send on Monday 04/04/2023 at  When ask pt if she is having any sx or check BP.  She states she is doing fine and doesn't matter if her BP is check. She is pissed that no one follow up on her.   Inform her the med at [facility] is ready tomorrow after 8am.

## 2023-04-07 NOTE — Telephone Encounter (Signed)
Pt is calling and need a refill on losartan (COZAAR) 25 MG tablet please send to  [facility] - [place], [place] - [address] Phone: [phone]  Fax: [phone]

## 2023-04-07 NOTE — Addendum Note (Signed)
Addended by: Vickii Chafe on: 04/07/2023 04:36 PM   Modules accepted: Orders

## 2023-04-07 NOTE — Telephone Encounter (Signed)
Rx sent to [facility]. 

## 2023-04-14 MED ORDER — LOSARTAN POTASSIUM 25 MG PO TABS: 25.0000 mg | ORAL_TABLET | Freq: Every day | ORAL | 1 refills | Status: DC

## 2023-04-14 NOTE — Addendum Note (Signed)
Addended by: Claudette Laws D on: 04/14/2023 02:07 PM   Modules accepted: Orders

## 2023-04-14 NOTE — Telephone Encounter (Signed)
Spoke with pt who expressed concerns of lack of communication regarding the refill of losartan. Pt states she was expecting a phone call regarding her medication within 24hrs of her call to the office. Pt also states Nelva Bush was very helpful & apologetic in her attempts to get the pt the information she needed. Pt became upset with CMA called her twice days after the 1st call when everything was taken care of by then. Will forward concerns to CMA.   Pt states [facility]. This RN noted it was no refills. Refilled for 6 month per protocol; pt aware.

## 2023-08-08 ENCOUNTER — Other Ambulatory Visit: Payer: Self-pay | Admitting: Internal Medicine

## 2023-12-05 ENCOUNTER — Encounter: Payer: Self-pay | Admitting: Internal Medicine

## 2023-12-05 LAB — HM MAMMOGRAPHY

## 2024-01-31 ENCOUNTER — Telehealth: Payer: Self-pay | Admitting: Internal Medicine

## 2024-01-31 NOTE — Telephone Encounter (Signed)
 Copied from CRM [id number]. Topic: Referral - Request for Referral >> Jan 31, 2024 12:10 PM Denese Killings wrote: Did the patient discuss referral with their provider in the last year? No (If No - schedule appointment) (If Yes - send message)  Appointment offered? Yes  Type of order/referral and detailed reason for visit: Dermatology, hair loss   Preference of office, provider, location: Lynnell Catalan,  [facility] [address] [place], [place]  If referral order, have you been seen by this specialty before? Yes (If Yes, this issue or another issue? When? Where? [facility] [place], [place]  Can we respond through MyChart? No

## 2024-03-22 ENCOUNTER — Telehealth: Payer: Self-pay | Admitting: Internal Medicine

## 2024-03-22 NOTE — Telephone Encounter (Unsigned)
 Copied from CRM [id number]. Topic: Referral - Request for Referral >> Mar 22, 2024  9:52 AM Sue Golden wrote: Did the patient discuss referral with their provider in the last year? No (If No - schedule appointment) (If Yes - send message)  Appointment offered? No  Type of order/referral and detailed reason for visit: Dermatology  Preference of office, provider, location: [facility] [place]   If referral order, have you been seen by this specialty before? No (If Yes, this issue or another issue? When? Where?  Can we respond through MyChart? Yes

## 2024-04-13 NOTE — Telephone Encounter (Signed)
 Spoke to pt and inform pt of message below. Also that some dermatology may not requires a referral. Pt states she had contacted them and they requested a referral from provider.   Pt inform she was not told of this when she called in few months ago in January for February. Pt states she will call if she decide to make an appt to be seen in office. Pt thank for the call and hang up the phone.   Fyi Just saw note today from February note that Padonda Webb okay to place the referral to [facility]. This cma did not see it until now.

## 2024-06-20 NOTE — Progress Notes (Unsigned)
 No chief complaint on file.   HPI: Patient  Sue Golden  63 y.o. comes in today for Preventive Health Care visit   Health Maintenance  Topic Date Due   HIV Screening  Never done   Zoster Vaccines- Shingrix (1 of 2) Never done   Colonoscopy  05/08/2020   COVID-19 Vaccine (4 - 2024-25 season) 08/14/2023   INFLUENZA VACCINE  07/13/2024   MAMMOGRAM  12/04/2024   DTaP/Tdap/Td (3 - Tdap) 09/04/2028   Hepatitis C Screening  Completed   Hepatitis B Vaccines  Aged Out   HPV VACCINES  Aged Out   Meningococcal B Vaccine  Aged Out   Health Maintenance Review LIFESTYLE:  Exercise:   Tobacco/ETS: Alcohol:  Sugar beverages: Sleep: Drug use: no HH of  Work:    ROS:  GEN/ HEENT: No fever, significant weight changes sweats headaches vision problems hearing changes, CV/ PULM; No chest pain shortness of breath cough, syncope,edema  change in exercise tolerance. GI /GU: No adominal pain, vomiting, change in bowel habits. No blood in the stool. No significant GU symptoms. SKIN/HEME: ,no acute skin rashes suspicious lesions or bleeding. No lymphadenopathy, nodules, masses.  NEURO/ PSYCH:  No neurologic signs such as weakness numbness. No depression anxiety. IMM/ Allergy: No unusual infections.  Allergy .   REST of 12 system review negative except as per HPI   Past Medical History:  Diagnosis Date   Anemia    Chicken pox     Past Surgical History:  Procedure Laterality Date   CESAREAN SECTION     PARTIAL HYSTERECTOMY     for bledding 1994    Family History  Problem Relation Age of Onset   Hypertension Mother    Diabetes Father    Dementia Sister        in 5s now in nursingg home no dx    Social History   Socioeconomic History   Marital status: Single    Spouse name: Not on file   Number of children: Not on file   Years of education: Not on file   Highest education level: Not on file  Occupational History   Not on file  Tobacco Use   Smoking status: Former    Smokeless tobacco: Never  Substance and Sexual Activity   Alcohol use: Yes    Alcohol/week: 2.0 standard drinks of alcohol    Types: 2 Glasses of wine per week   Drug use: No   Sexual activity: Not on file  Other Topics Concern   Not on file  Social History Narrative   hh of 2    Pet dog   Working [employer] 8 - 5    Cleaning  5 days a week.    No tobacco   caffiene in am    ETOh.   On feet a lot    Social Drivers of Corporate investment banker Strain: Not on file  Food Insecurity: Not on file  Transportation Needs: Not on file  Physical Activity: Not on file  Stress: Not on file  Social Connections: Not on file    Outpatient Medications Prior to Visit  Medication Sig Dispense Refill   losartan  (COZAAR ) 25 MG tablet TAKE 1 TABLET DAILY 90 tablet 3   No facility-administered medications prior to visit.     EXAM:  LMP 12/13/1992 (Approximate)   There is no height or weight on file to calculate BMI. Wt Readings from Last 3 Encounters:  12/27/22 181 lb (82.1 kg)  01/20/21 177 lb (80.3 kg)  12/31/19 174 lb 9.6 oz (79.2 kg)    Physical Exam: Vital signs reviewed HZW:Uypd is a well-developed well-nourished alert cooperative    who appearsr stated age in no acute distress.  HEENT: normocephalic atraumatic , Eyes: PERRL EOM's full, conjunctiva clear, Nares: paten,t no deformity discharge or tenderness., Ears: no deformity EAC's clear TMs with normal landmarks. Mouth: clear OP, no lesions, edema.  Moist mucous membranes. Dentition in adequate repair. NECK: supple without masses, thyromegaly or bruits. CHEST/PULM:  Clear to auscultation and percussion breath sounds equal no wheeze , rales or rhonchi. No chest wall deformities or tenderness. Breast: normal by inspection . No dimpling, discharge, masses, tenderness or discharge . CV: PMI is nondisplaced, S1 S2 no gallops, murmurs, rubs. Peripheral pulses are full without delay.No JVD .  ABDOMEN: Bowel sounds normal  nontender  No guard or rebound, no hepato splenomegal no CVA tenderness.  Extremtities:  No clubbing cyanosis or edema, no acute joint swelling or redness no focal atrophy NEURO:  Oriented x3, cranial nerves 3-12 appear to be intact, no obvious focal weakness,gait within normal limits no abnormal reflexes or asymmetrical SKIN: No acute rashes normal turgor, color, no bruising or petechiae. PSYCH: Oriented, good eye contact, no obvious depression anxiety, cognition and judgment appear normal. LN: no cervical axillary adenopathy  Lab Results  Component Value Date   WBC 2.8 (L) 12/27/2022   HGB 13.1 12/27/2022   HCT 39.5 12/27/2022   PLT 232.0 12/27/2022   GLUCOSE 104 (H) 12/27/2022   CHOL 184 03/28/2023   TRIG 74.0 03/28/2023   HDL 57.80 03/28/2023   LDLDIRECT 131.2 10/24/2012   LDLCALC 112 (H) 03/28/2023   ALT 14 12/27/2022   AST 13 12/27/2022   NA 138 12/27/2022   K 5.0 12/27/2022   CL 106 12/27/2022   CREATININE 0.94 12/27/2022   BUN 14 12/27/2022   CO2 26 12/27/2022   TSH 0.54 12/27/2022   HGBA1C 6.2 01/20/2021    BP Readings from Last 3 Encounters:  12/27/22 (!) 144/92  01/20/21 128/84  12/31/19 130/80    Lab plan reviewed with patient   ASSESSMENT AND PLAN:  Discussed the following assessment and plan:    ICD-10-CM   1. Visit for preventive health examination  Z00.00      No follow-ups on file.  Patient Care Team: Erikson Danzy K, MD as PCP - General There are no Patient Instructions on file for this visit.  Betzy Barbier K. Wildon Cuevas M.D.

## 2024-06-21 ENCOUNTER — Encounter: Payer: Self-pay | Admitting: Internal Medicine

## 2024-06-21 ENCOUNTER — Ambulatory Visit (INDEPENDENT_AMBULATORY_CARE_PROVIDER_SITE_OTHER): Admitting: Internal Medicine

## 2024-06-21 VITALS — BP 126/86 | HR 97 | Temp 98.2°F | Ht 64.0 in | Wt 176.0 lb

## 2024-06-21 DIAGNOSIS — Z0000 Encounter for general adult medical examination without abnormal findings: Secondary | ICD-10-CM | POA: Diagnosis not present

## 2024-06-21 DIAGNOSIS — L659 Nonscarring hair loss, unspecified: Secondary | ICD-10-CM

## 2024-06-21 DIAGNOSIS — I10 Essential (primary) hypertension: Secondary | ICD-10-CM | POA: Diagnosis not present

## 2024-06-21 DIAGNOSIS — D708 Other neutropenia: Secondary | ICD-10-CM | POA: Diagnosis not present

## 2024-06-21 DIAGNOSIS — Z79899 Other long term (current) drug therapy: Secondary | ICD-10-CM | POA: Diagnosis not present

## 2024-06-21 LAB — COMPREHENSIVE METABOLIC PANEL WITH GFR
ALT: 17 U/L (ref 0–35)
AST: 14 U/L (ref 0–37)
Albumin: 4.3 g/dL (ref 3.5–5.2)
Alkaline Phosphatase: 72 U/L (ref 39–117)
BUN: 19 mg/dL (ref 6–23)
CO2: 26 meq/L (ref 19–32)
Calcium: 10 mg/dL (ref 8.4–10.5)
Chloride: 107 meq/L (ref 96–112)
Creatinine, Ser: 0.92 mg/dL (ref 0.40–1.20)
GFR: 66.14 mL/min (ref >60.00)
Glucose, Bld: 96 mg/dL (ref 70–99)
Potassium: 4.4 meq/L (ref 3.5–5.1)
Sodium: 137 meq/L (ref 135–145)
Total Bilirubin: 0.5 mg/dL (ref 0.2–1.2)
Total Protein: 8 g/dL (ref 6.0–8.3)

## 2024-06-21 LAB — CBC WITH DIFFERENTIAL/PLATELET
Basophils Absolute: 0 K/uL (ref 0.0–0.1)
Basophils Relative: 0.3 % (ref 0.0–3.0)
Eosinophils Absolute: 0 K/uL (ref 0.0–0.7)
Eosinophils Relative: 1.5 % (ref 0.0–5.0)
HCT: 39.2 % (ref 36.0–46.0)
Hemoglobin: 13 g/dL (ref 12.0–15.0)
Lymphocytes Relative: 50.8 % — ABNORMAL HIGH (ref 12.0–46.0)
Lymphs Abs: 1.2 K/uL (ref 0.7–4.0)
MCHC: 33.2 g/dL (ref 30.0–36.0)
MCV: 90 fl (ref 78.0–100.0)
Monocytes Absolute: 0.3 K/uL (ref 0.1–1.0)
Monocytes Relative: 11.3 % (ref 3.0–12.0)
Neutro Abs: 0.9 K/uL — ABNORMAL LOW (ref 1.4–7.7)
Neutrophils Relative %: 36.1 % — ABNORMAL LOW (ref 43.0–77.0)
Platelets: 203 K/uL (ref 150.0–400.0)
RBC: 4.35 Mil/uL (ref 3.87–5.11)
RDW: 13 % (ref 11.5–15.5)
WBC: 2.4 K/uL — ABNORMAL LOW (ref 4.0–10.5)

## 2024-06-21 LAB — LIPID PANEL
Cholesterol: 233 mg/dL — ABNORMAL HIGH (ref 0–200)
HDL: 61.5 mg/dL (ref >39.00)
LDL Cholesterol: 158 mg/dL — ABNORMAL HIGH (ref 0–99)
NonHDL: 171.33
Total CHOL/HDL Ratio: 4
Triglycerides: 69 mg/dL (ref 0.0–149.0)
VLDL: 13.8 mg/dL (ref 0.0–40.0)

## 2024-06-21 LAB — TSH: TSH: 0.87 u[IU]/mL (ref 0.35–5.50)

## 2024-06-21 LAB — T4, FREE: Free T4: 0.72 ng/dL (ref 0.60–1.60)

## 2024-06-21 LAB — HEMOGLOBIN A1C: Hgb A1c MFr Bld: 6.3 % (ref 4.6–6.5)

## 2024-06-21 NOTE — Patient Instructions (Addendum)
 Good to see you today  Updated labs  and extra for hair loss. Will do referral to derm as requested .  Optimize bp control  we can always increase the losartan  to 50 mg per day . Continue lifestyle intervention healthy eating and exercise .   Bp goalis average below 130/80 range   optimum  best 120/80 range and below

## 2024-06-24 ENCOUNTER — Ambulatory Visit: Payer: Self-pay | Admitting: Internal Medicine

## 2024-06-24 LAB — ANA: Anti Nuclear Antibody (ANA): POSITIVE — AB

## 2024-06-24 LAB — ANTI-NUCLEAR AB-TITER (ANA TITER): ANA Titer 1: 1:40 {titer} — ABNORMAL HIGH

## 2024-06-24 NOTE — Progress Notes (Signed)
 Cholesterol up from baseline  Lower WBC as in past  Blood sugar and thyroid  normal . Ana marker  is borderline  but probably clinically  insignificant finding as to cause of hair changes  but    Dermatology specialist should review when you see her  Optimize healthy  diet and exercise

## 2024-07-16 ENCOUNTER — Other Ambulatory Visit: Payer: Self-pay | Admitting: Internal Medicine

## 2024-12-12 LAB — HM MAMMOGRAPHY

## 2024-12-14 ENCOUNTER — Encounter: Payer: Self-pay | Admitting: Internal Medicine

## 2025-02-04 ENCOUNTER — Ambulatory Visit: Admitting: Dermatology
